# Patient Record
Sex: Female | Born: 1990 | Race: White | Hispanic: No | Smoking: Former smoker
Health system: Southern US, Community
[De-identification: ages and names within clinical notes are randomized; demographics above are authoritative.]

## PROBLEM LIST (undated history)

## (undated) ENCOUNTER — Inpatient Hospital Stay (HOSPITAL_COMMUNITY): Payer: Self-pay

## (undated) DIAGNOSIS — C439 Malignant melanoma of skin, unspecified: Secondary | ICD-10-CM

## (undated) DIAGNOSIS — T7840XA Allergy, unspecified, initial encounter: Secondary | ICD-10-CM

## (undated) DIAGNOSIS — M419 Scoliosis, unspecified: Secondary | ICD-10-CM

## (undated) HISTORY — DX: Allergy, unspecified, initial encounter: T78.40XA

## (undated) HISTORY — DX: Scoliosis, unspecified: M41.9

## (undated) HISTORY — PX: TONSILLECTOMY: SUR1361

## (undated) HISTORY — DX: Malignant melanoma of skin, unspecified: C43.9

---
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
## 2011-03-15 ENCOUNTER — Encounter: Payer: Self-pay | Admitting: Family Medicine

## 2011-04-12 ENCOUNTER — Emergency Department (HOSPITAL_COMMUNITY)
Admission: EM | Admit: 2011-04-12 | Discharge: 2011-04-12 | Disposition: A | Discharge status: Home / Self Care | Payer: Self-pay | Attending: Emergency Medicine | Admitting: Emergency Medicine

## 2011-04-12 ENCOUNTER — Emergency Department (HOSPITAL_COMMUNITY): Payer: Self-pay

## 2011-04-12 DIAGNOSIS — F3289 Other specified depressive episodes: Secondary | ICD-10-CM | POA: Insufficient documentation

## 2011-04-12 DIAGNOSIS — M79609 Pain in unspecified limb: Secondary | ICD-10-CM | POA: Insufficient documentation

## 2011-04-12 DIAGNOSIS — M545 Low back pain, unspecified: Secondary | ICD-10-CM | POA: Insufficient documentation

## 2011-04-12 DIAGNOSIS — F329 Major depressive disorder, single episode, unspecified: Secondary | ICD-10-CM | POA: Insufficient documentation

## 2011-04-12 DIAGNOSIS — H5710 Ocular pain, unspecified eye: Secondary | ICD-10-CM | POA: Insufficient documentation

## 2011-04-12 DIAGNOSIS — R110 Nausea: Secondary | ICD-10-CM | POA: Insufficient documentation

## 2011-04-12 DIAGNOSIS — F411 Generalized anxiety disorder: Secondary | ICD-10-CM | POA: Insufficient documentation

## 2011-04-12 DIAGNOSIS — H538 Other visual disturbances: Secondary | ICD-10-CM | POA: Insufficient documentation

## 2011-04-12 DIAGNOSIS — R51 Headache: Secondary | ICD-10-CM | POA: Insufficient documentation

## 2011-04-12 DIAGNOSIS — IMO0001 Reserved for inherently not codable concepts without codable children: Secondary | ICD-10-CM | POA: Insufficient documentation

## 2011-04-12 DIAGNOSIS — M546 Pain in thoracic spine: Secondary | ICD-10-CM | POA: Insufficient documentation

## 2011-04-12 LAB — URINE MICROSCOPIC-ADD ON

## 2011-04-12 LAB — URINALYSIS, ROUTINE W REFLEX MICROSCOPIC
Bilirubin Urine: NEGATIVE
Glucose, UA: NEGATIVE mg/dL
Ketones, ur: 15 mg/dL — AB
Protein, ur: NEGATIVE mg/dL
pH: 5.5 (ref 5.0–8.0)

## 2011-05-05 ENCOUNTER — Inpatient Hospital Stay (INDEPENDENT_AMBULATORY_CARE_PROVIDER_SITE_OTHER)
Admission: RE | Admit: 2011-05-05 | Discharge: 2011-05-05 | Disposition: A | Discharge status: Home / Self Care | Payer: Medicaid Other | Source: Ambulatory Visit | Attending: Emergency Medicine | Admitting: Emergency Medicine

## 2011-05-05 DIAGNOSIS — L989 Disorder of the skin and subcutaneous tissue, unspecified: Secondary | ICD-10-CM

## 2011-05-05 DIAGNOSIS — R599 Enlarged lymph nodes, unspecified: Secondary | ICD-10-CM

## 2011-11-15 ENCOUNTER — Inpatient Hospital Stay (HOSPITAL_COMMUNITY)
Admission: AD | Admit: 2011-11-15 | Discharge: 2011-11-15 | Disposition: A | Discharge status: Home / Self Care | Payer: Medicaid Other | Source: Ambulatory Visit | Attending: Obstetrics & Gynecology | Admitting: Obstetrics & Gynecology

## 2011-11-15 ENCOUNTER — Encounter (HOSPITAL_COMMUNITY): Payer: Self-pay

## 2011-11-15 ENCOUNTER — Inpatient Hospital Stay (HOSPITAL_COMMUNITY): Payer: Medicaid Other

## 2011-11-15 DIAGNOSIS — R109 Unspecified abdominal pain: Secondary | ICD-10-CM | POA: Insufficient documentation

## 2011-11-15 DIAGNOSIS — O09899 Supervision of other high risk pregnancies, unspecified trimester: Secondary | ICD-10-CM

## 2011-11-15 DIAGNOSIS — O99891 Other specified diseases and conditions complicating pregnancy: Secondary | ICD-10-CM | POA: Insufficient documentation

## 2011-11-15 DIAGNOSIS — O26899 Other specified pregnancy related conditions, unspecified trimester: Secondary | ICD-10-CM

## 2011-11-15 LAB — CBC
HCT: 34.3 % — ABNORMAL LOW (ref 36.0–46.0)
MCH: 30.5 pg (ref 26.0–34.0)
MCHC: 34.1 g/dL (ref 30.0–36.0)
MCV: 89.3 fL (ref 78.0–100.0)
Platelets: 275 10*3/uL (ref 150–400)
RDW: 13.3 % (ref 11.5–15.5)
WBC: 9.6 10*3/uL (ref 4.0–10.5)

## 2011-11-15 LAB — URINALYSIS, ROUTINE W REFLEX MICROSCOPIC
Bilirubin Urine: NEGATIVE
Glucose, UA: NEGATIVE mg/dL
Hgb urine dipstick: NEGATIVE
Ketones, ur: NEGATIVE mg/dL
Protein, ur: NEGATIVE mg/dL
Urobilinogen, UA: 0.2 mg/dL (ref 0.0–1.0)

## 2011-11-15 LAB — WET PREP, GENITAL
Clue Cells Wet Prep HPF POC: NONE SEEN
Trich, Wet Prep: NONE SEEN
Yeast Wet Prep HPF POC: NONE SEEN

## 2011-11-15 NOTE — MAU Provider Note (Signed)
Agree with note. See my note about plan of care.

## 2011-11-15 NOTE — MAU Note (Signed)
Patient states she had her first baby at 24 weeks at [facility] . Has been getting prenatal care in [place] but has been about one month she has been seen. Plans to stay in [facility] for delivery. Was told in [place] she need 17 P injections and a cerclage due to her first delivery. Has had no care in [place]. Has been having pain on the right side for about one month with a moderate green discharge. Had intercourse last night.

## 2011-11-15 NOTE — Discharge Instructions (Signed)
Abdominal Pain During Pregnancy °Belly (abdominal) pain is common during pregnancy. Most of the time, it is not a serious problem. Other times, it can be a sign that something is wrong with the pregnancy. Always tell your doctor if you have belly pain. °HOME CARE °For mild pain: °· Do not have sex (intercourse) or put anything in your vagina until you feel better.  °· Rest until your pain stops. If your pain lasts longer than 1 hour, call your doctor.  °· Drink clear fluids if you feel sick to your stomach (nauseous).  °· Do not eat solid food until you feel better.  °· Only take medicine as told by your doctor.  °· Keep all doctor visits as told.  °GET HELP RIGHT AWAY IF:  °· You are bleeding, leaking fluid, or pieces of tissue come out of your vagina.  °· You have more pain or cramping.  °· You keep throwing up (vomiting).  °· You have pain when you pee (urinate) or have blood in your pee.  °· You have a fever.  °· You do not feel your baby moving as much.  °· You feel very weak or feel like passing out.  °· You have trouble breathing, with or without belly pain.  °· You have a very bad headache and belly pain.  °· You have fluid leaking from your vagina and belly pain.  °· You keep having watery poop (diarrhea).  °· Your belly pain does not go away after resting, or the pain gets worse.  °MAKE SURE YOU:  °· Understand these instructions.  °· Will watch your condition.  °· Will get help right away if you are not doing well or get worse.  °Document Released: 07/18/2009 Document Revised: 07/19/2011 Document Reviewed: 02/23/2011 °[employer]® Patient Information ©2012 [employer]. °

## 2011-11-15 NOTE — Plan of Care (Signed)
I reviewed the patient's history and today's findings. She plans to start care with [facility], needs here records from [place] and Medicaid transferred. Recommend she start care ASAP so that 17-P can be started and decision can be made about the advisability of cerclage. We will request her prenatal records be sent to the MAU.  ARNOLD,JAMES 3:45 PM

## 2011-11-15 NOTE — MAU Provider Note (Signed)
History     CSN: [id number]  Arrival date & time 11/15/11  1151   None     Chief Complaint  Patient presents with  . Abdominal Pain    HPI Brenda Joyce is a 21 y.o. female @ [redacted]w[redacted]d gestation who presents to MAU for lower abdominal cramping and vaginal discharge that is green. The symptoms started about a month ago. Started prenatal care in [place] and had an ultrasound at [redacted] weeks gestation. Patient is concerned because she is having the same symptoms as she did when she delivered a preterm baby at [redacted] weeks gestation. (Baby did live).  Patient told incompetent cervix. The history was provided by the patient.  Past Medical History  Diagnosis Date  . Asthma   . Allergy   . Scoliosis   . Melanoma     right wrist    Past Surgical History  Procedure Date  . Tonsillectomy     Family History  Problem Relation Age of Onset  . Anesthesia problems Neg Hx     History  Substance Use Topics  . Smoking status: Never Smoker   . Smokeless tobacco: Not on file  . Alcohol Use: No    OB History    Grav Para Term Preterm Abortions TAB SAB Ect Mult Living   2 1 0 1 0 0 0 0 0 1       Review of Systems  Constitutional: Negative for fever, chills, diaphoresis and fatigue.  HENT: Negative for ear pain, congestion, sore throat, facial swelling, neck pain, neck stiffness, dental problem and sinus pressure.   Eyes: Negative for photophobia, pain and discharge.  Respiratory: Negative for cough, chest tightness and wheezing.   Gastrointestinal: Negative for nausea, vomiting, abdominal pain, diarrhea, constipation and abdominal distention.  Genitourinary: Positive for vaginal discharge. Negative for dysuria, frequency, flank pain, vaginal bleeding and difficulty urinating.  Musculoskeletal: Negative for myalgias, back pain and gait problem.  Skin: Negative for color change and rash.  Neurological: Negative for dizziness, speech difficulty, weakness, light-headedness, numbness and headaches.    Psychiatric/Behavioral: Negative for confusion and agitation.    Allergies  Review of patient's allergies indicates no known allergies.  Home Medications  No current outpatient prescriptions on file.  BP 142/98  Pulse 120  Temp(Src) 98.1 F (36.7 C) (Oral)  Resp 16  Ht 5\' 3"  (1.6 m)  Wt 118 lb 9.6 oz (53.797 kg)  BMI 21.01 kg/m2  SpO2 100%  LMP 08/06/2011  Physical Exam  Nursing note and vitals reviewed. Constitutional: She is oriented to person, place, and time. She appears well-developed and well-nourished. No distress.  HENT:  Head: Normocephalic.  Eyes: EOM are normal.  Neck: Neck supple.  Cardiovascular: Normal rate.   Pulmonary/Chest: Effort normal.  Abdominal: Soft. There is no tenderness.  Genitourinary:       External genitalia without lesions, mucous discharge vaginal vault. Cervix, external os fingertip, internal os closed. Uterus 14 to 16 week size.   Musculoskeletal: Normal range of motion.  Neurological: She is alert and oriented to person, place, and time. No cranial nerve deficit.  Skin: Skin is warm and dry.  Psychiatric: She has a normal mood and affect. Her behavior is normal. Judgment and thought content normal.   . Results for orders placed during the hospital encounter of 11/15/11 (from the past 24 hour(s))  URINALYSIS, ROUTINE W REFLEX MICROSCOPIC     Status: Abnormal   Collection Time   11/15/11 12:25 PM      Component  Value Range   Color, Urine YELLOW  YELLOW    APPearance HAZY (*) CLEAR    Specific Gravity, Urine 1.025  1.005 - 1.030    pH 6.0  5.0 - 8.0    Glucose, UA NEGATIVE  NEGATIVE (mg/dL)   Hgb urine dipstick NEGATIVE  NEGATIVE    Bilirubin Urine NEGATIVE  NEGATIVE    Ketones, ur NEGATIVE  NEGATIVE (mg/dL)   Protein, ur NEGATIVE  NEGATIVE (mg/dL)   Urobilinogen, UA 0.2  0.0 - 1.0 (mg/dL)   Nitrite NEGATIVE  NEGATIVE    Leukocytes, UA NEGATIVE  NEGATIVE   CBC     Status: Abnormal   Collection Time   11/15/11  1:05 PM       Component Value Range   WBC 9.6  4.0 - 10.5 (K/uL)   RBC 3.84 (*) 3.87 - 5.11 (MIL/uL)   Hemoglobin 11.7 (*) 12.0 - 15.0 (g/dL)   HCT 40.9 (*) 81.1 - 46.0 (%)   MCV 89.3  78.0 - 100.0 (fL)   MCH 30.5  26.0 - 34.0 (pg)   MCHC 34.1  30.0 - 36.0 (g/dL)   RDW 91.4  78.2 - 95.6 (%)   Platelets 275  150 - 400 (K/uL)  ABO/RH     Status: Normal (Preliminary result)   Collection Time   11/15/11  1:05 PM      Component Value Range   ABO/RH(D) A POS     Ultrasound shows a 15 week IUP with cervical length 3.1 cm.  Patient had prenatal care with [facility] with last pregnancy and plan care there again but they will not schedule an appointment until she has her records.   Assessment: Abdominal pain in pregnancy  Plan:   Start prenatal care ED Course: Consult with Dr. Debroah Loop. Will come to MAU to discuss plan of care with patient.  Procedures  Will have patient sign release of information so she can take prenatal records from [place] to Dr. Ebony Hail office. Dr. Debroah Loop discussed with the patient in detail follow up and prenatal care. ( See Dr. Olivia Mackie note)   MDM

## 2011-11-16 LAB — GC/CHLAMYDIA PROBE AMP, GENITAL: Chlamydia, DNA Probe: NEGATIVE

## 2011-11-23 ENCOUNTER — Inpatient Hospital Stay (HOSPITAL_COMMUNITY)
Admission: AD | Admit: 2011-11-23 | Discharge: 2011-11-23 | Disposition: A | Discharge status: Home / Self Care | Payer: Medicaid Other | Source: Ambulatory Visit | Attending: Obstetrics & Gynecology | Admitting: Obstetrics & Gynecology

## 2011-11-23 ENCOUNTER — Encounter (HOSPITAL_COMMUNITY): Payer: Self-pay | Admitting: *Deleted

## 2011-11-23 DIAGNOSIS — N949 Unspecified condition associated with female genital organs and menstrual cycle: Secondary | ICD-10-CM

## 2011-11-23 DIAGNOSIS — O99891 Other specified diseases and conditions complicating pregnancy: Secondary | ICD-10-CM | POA: Insufficient documentation

## 2011-11-23 DIAGNOSIS — R109 Unspecified abdominal pain: Secondary | ICD-10-CM | POA: Insufficient documentation

## 2011-11-23 DIAGNOSIS — O09219 Supervision of pregnancy with history of pre-term labor, unspecified trimester: Secondary | ICD-10-CM

## 2011-11-23 LAB — URINALYSIS, ROUTINE W REFLEX MICROSCOPIC
Bilirubin Urine: NEGATIVE
Leukocytes, UA: NEGATIVE
Nitrite: NEGATIVE
Specific Gravity, Urine: 1.025 (ref 1.005–1.030)
Urobilinogen, UA: 0.2 mg/dL (ref 0.0–1.0)
pH: 6.5 (ref 5.0–8.0)

## 2011-11-23 NOTE — MAU Provider Note (Signed)
History     CSN: [id number]  Arrival date and time: 11/23/11 1510   First Provider Initiated Contact with Patient 11/23/11 1608      Chief Complaint  Patient presents with  . Abdominal Cramping   HPI 21 y.o. G2P0101 at [redacted]w[redacted]d with low abd pain, constant pain that is crampy in nature in bilateral lower quadrants, worse with walking or sudden movements (like driving over bumps in her car). No bleeding or discharge. H/O preterm delivery at 24 weeks. Was seen in MAU a few days ago for the same symptoms, states her pain is worsening now. She has been planning on prenatal care with [facility], she stated at her last MAU visit that she could not start her care there until she got her prior records from [place], however today she states she can't schedule an appointment there until her Medicaid, which she just applied for, is approved. At last visit, wet prep, cultures and UA were negative, normal u/s with cervical length of 3.1 cm.    Past Medical History  Diagnosis Date  . Asthma   . Allergy   . Scoliosis   . Melanoma     right wrist    Past Surgical History  Procedure Date  . Tonsillectomy   . Tonsilectomy, adenoidectomy, bilateral myringotomy and tubes 2006    Family History  Problem Relation Age of Onset  . Anesthesia problems Neg Hx   . Cancer Mother   . Hypertension Mother   . Mental illness Father   . Cancer Sister     History  Substance Use Topics  . Smoking status: Never Smoker   . Smokeless tobacco: Never Used  . Alcohol Use: No    Allergies: No Known Allergies  No prescriptions prior to admission    Review of Systems  Constitutional: Negative.   Respiratory: Negative.   Cardiovascular: Negative.   Gastrointestinal: Positive for abdominal pain. Negative for nausea, vomiting, diarrhea and constipation.  Genitourinary: Negative for dysuria, urgency, frequency, hematuria and flank pain.       Negative for vaginal bleeding  Musculoskeletal: Negative.    Neurological: Negative.   Psychiatric/Behavioral: Negative.    Physical Exam   Blood pressure 107/64, pulse 93, temperature 98.9 F (37.2 C), temperature source Oral, resp. rate 16, last menstrual period 08/06/2011, SpO2 100.00%, unknown if currently breastfeeding.  Physical Exam  Nursing note and vitals reviewed. Constitutional: She is oriented to person, place, and time. She appears well-developed and well-nourished. No distress.  Cardiovascular: Normal rate.   Respiratory: Effort normal.  GI: Soft. She exhibits no distension and no mass. There is tenderness (bilateral lower quadrants). There is no rebound and no guarding.  Genitourinary:       SVE: closed/thick/high  Musculoskeletal: Normal range of motion.  Neurological: She is alert and oriented to person, place, and time.  Skin: Skin is warm and dry.  Psychiatric: She has a normal mood and affect.    MAU Course  Procedures Results for orders placed during the hospital encounter of 11/23/11 (from the past 24 hour(s))  URINALYSIS, ROUTINE W REFLEX MICROSCOPIC     Status: Abnormal   Collection Time   11/23/11  3:20 PM      Component Value Range   Color, Urine YELLOW  YELLOW    APPearance HAZY (*) CLEAR    Specific Gravity, Urine 1.025  1.005 - 1.030    pH 6.5  5.0 - 8.0    Glucose, UA NEGATIVE  NEGATIVE (mg/dL)   Hgb  urine dipstick NEGATIVE  NEGATIVE    Bilirubin Urine NEGATIVE  NEGATIVE    Ketones, ur NEGATIVE  NEGATIVE (mg/dL)   Protein, ur NEGATIVE  NEGATIVE (mg/dL)   Urobilinogen, UA 0.2  0.0 - 1.0 (mg/dL)   Nitrite NEGATIVE  NEGATIVE    Leukocytes, UA NEGATIVE  NEGATIVE      Assessment and Plan  21 y.o. G2P0101 at [redacted]w[redacted]d Low abd pain - c/w round ligament pain History of 24 week delivery Request sent to clinic for follow up in 1-2 weeks - pt will not have medicaid approved for at least a month, needs closer follow up d/t history Precautions rev'd  Brenda Joyce 11/23/2011, 4:17 PM

## 2011-11-23 NOTE — MAU Note (Signed)
Patient having lower abdominal cramping and upper abdominal pain, especially when walking, no vaginal discharge, no vaginal bleeding, regular bowel movements, felt dizzy before and almost passed out, patient is [redacted] week gestation.

## 2011-11-26 ENCOUNTER — Encounter: Payer: Self-pay | Admitting: Family

## 2011-11-27 NOTE — MAU Provider Note (Signed)
Patient needs cervical cerclage placement in next 1-2 weeks.  Please have her seen in HR clinic.  Attestation of Attending Supervision of Advanced Practitioner: Evaluation and management procedures were performed by the [facility] Fellow/PA/CNM/NP under my supervision and collaboration. Chart reviewed, and agree with management and plan.  Jaynie Collins, M.D. 11/27/2011 11:28 AM

## 2011-12-03 ENCOUNTER — Ambulatory Visit (INDEPENDENT_AMBULATORY_CARE_PROVIDER_SITE_OTHER): Payer: BC Managed Care – PPO | Admitting: Family Medicine

## 2011-12-03 ENCOUNTER — Encounter: Payer: Self-pay | Admitting: Family

## 2011-12-03 ENCOUNTER — Encounter: Payer: Self-pay | Admitting: Family Medicine

## 2011-12-03 VITALS — BP 107/67 | HR 94 | Temp 97.9°F | Wt 120.7 lb

## 2011-12-03 DIAGNOSIS — J309 Allergic rhinitis, unspecified: Secondary | ICD-10-CM

## 2011-12-03 DIAGNOSIS — J3089 Other allergic rhinitis: Secondary | ICD-10-CM | POA: Insufficient documentation

## 2011-12-03 DIAGNOSIS — O09219 Supervision of pregnancy with history of pre-term labor, unspecified trimester: Secondary | ICD-10-CM

## 2011-12-03 DIAGNOSIS — J4530 Mild persistent asthma, uncomplicated: Secondary | ICD-10-CM | POA: Insufficient documentation

## 2011-12-03 DIAGNOSIS — J45909 Unspecified asthma, uncomplicated: Secondary | ICD-10-CM

## 2011-12-03 DIAGNOSIS — O0990 Supervision of high risk pregnancy, unspecified, unspecified trimester: Secondary | ICD-10-CM | POA: Insufficient documentation

## 2011-12-03 DIAGNOSIS — M4120 Other idiopathic scoliosis, site unspecified: Secondary | ICD-10-CM

## 2011-12-03 DIAGNOSIS — C4360 Malignant melanoma of unspecified upper limb, including shoulder: Secondary | ICD-10-CM

## 2011-12-03 DIAGNOSIS — M419 Scoliosis, unspecified: Secondary | ICD-10-CM

## 2011-12-03 LAB — POCT URINALYSIS DIP (DEVICE)
Bilirubin Urine: NEGATIVE
Leukocytes, UA: NEGATIVE
Nitrite: NEGATIVE
Protein, ur: NEGATIVE mg/dL
pH: 5 (ref 5.0–8.0)

## 2011-12-03 NOTE — Progress Notes (Signed)
Discussed cerclage--will check serial cervical lengths, start 17 P---cervix is long/closed and posterior--will follow with serial cervical lengths. Declines genetics Schedule anatomy Get records from [place], had new OB labs and pap there.

## 2011-12-03 NOTE — Progress Notes (Signed)
U/S scheduled 12/05/11 at 915 am and on 12/17/11 at 1030 am. ROI signed for [facility] in [place].

## 2011-12-03 NOTE — Progress Notes (Signed)
Pulse- 94 

## 2011-12-03 NOTE — Patient Instructions (Signed)
Pregnancy - Second Trimester The second trimester of pregnancy (3 to 6 months) is a period of rapid growth for you and your baby. At the end of the sixth month, your baby is about 9 inches long and weighs 1 1/2 pounds. You will begin to feel the baby move between 18 and 20 weeks of the pregnancy. This is called quickening. Weight gain is faster. A clear fluid (colostrum) may leak out of your breasts. You may feel small contractions of the womb (uterus). This is known as false labor or Braxton-Hicks contractions. This is like a practice for labor when the baby is ready to be born. Usually, the problems with morning sickness have usually passed by the end of your first trimester. Some women develop small dark blotches (called cholasma, mask of pregnancy) on their face that usually goes away after the baby is born. Exposure to the sun makes the blotches worse. Acne may also develop in some pregnant women and pregnant women who have acne, may find that it goes away. PRENATAL EXAMS  Blood work may continue to be done during prenatal exams. These tests are done to check on your health and the probable health of your baby. Blood work is used to follow your blood levels (hemoglobin). Anemia (low hemoglobin) is common during pregnancy. Iron and vitamins are given to help prevent this. You will also be checked for diabetes between 24 and 28 weeks of the pregnancy. Some of the previous blood tests may be repeated.   The size of the uterus is measured during each visit. This is to make sure that the baby is continuing to grow properly according to the dates of the pregnancy.   Your blood pressure is checked every prenatal visit. This is to make sure you are not getting toxemia.   Your urine is checked to make sure you do not have an infection, diabetes or protein in the urine.   Your weight is checked often to make sure gains are happening at the suggested rate. This is to ensure that both you and your baby are  growing normally.   Sometimes, an ultrasound is performed to confirm the proper growth and development of the baby. This is a test which bounces harmless sound waves off the baby so your caregiver can more accurately determine due dates.  Sometimes, a specialized test is done on the amniotic fluid surrounding the baby. This test is called an amniocentesis. The amniotic fluid is obtained by sticking a needle into the belly (abdomen). This is done to check the chromosomes in instances where there is a concern about possible genetic problems with the baby. It is also sometimes done near the end of pregnancy if an early delivery is required. In this case, it is done to help make sure the baby's lungs are mature enough for the baby to live outside of the womb. CHANGES OCCURING IN THE SECOND TRIMESTER OF PREGNANCY Your body goes through many changes during pregnancy. They vary from person to person. Talk to your caregiver about changes you notice that you are concerned about.  During the second trimester, you will likely have an increase in your appetite. It is normal to have cravings for certain foods. This varies from person to person and pregnancy to pregnancy.   Your lower abdomen will begin to bulge.   You may have to urinate more often because the uterus and baby are pressing on your bladder. It is also common to get more bladder infections during pregnancy (  pain with urination). You can help this by drinking lots of fluids and emptying your bladder before and after intercourse.   You may begin to get stretch marks on your hips, abdomen, and breasts. These are normal changes in the body during pregnancy. There are no exercises or medications to take that prevent this change.   You may begin to develop swollen and bulging veins (varicose veins) in your legs. Wearing support hose, elevating your feet for 15 minutes, 3 to 4 times a day and limiting salt in your diet helps lessen the problem.    Heartburn may develop as the uterus grows and pushes up against the stomach. Antacids recommended by your caregiver helps with this problem. Also, eating smaller meals 4 to 5 times a day helps.   Constipation can be treated with a stool softener or adding bulk to your diet. Drinking lots of fluids, vegetables, fruits, and whole grains are helpful.   Exercising is also helpful. If you have been very active up until your pregnancy, most of these activities can be continued during your pregnancy. If you have been less active, it is helpful to start an exercise program such as walking.   Hemorrhoids (varicose veins in the rectum) may develop at the end of the second trimester. Warm sitz baths and hemorrhoid cream recommended by your caregiver helps hemorrhoid problems.   Backaches may develop during this time of your pregnancy. Avoid heavy lifting, wear low heal shoes and practice good posture to help with backache problems.   Some pregnant women develop tingling and numbness of their hand and fingers because of swelling and tightening of ligaments in the wrist (carpel tunnel syndrome). This goes away after the baby is born.   As your breasts enlarge, you may have to get a bigger bra. Get a comfortable, cotton, support bra. Do not get a nursing bra until the last month of the pregnancy if you will be nursing the baby.   You may get a dark line from your belly button to the pubic area called the linea nigra.   You may develop rosy cheeks because of increase blood flow to the face.   You may develop spider looking lines of the face, neck, arms and chest. These go away after the baby is born.  HOME CARE INSTRUCTIONS   It is extremely important to avoid all smoking, herbs, alcohol, and unprescribed drugs during your pregnancy. These chemicals affect the formation and growth of the baby. Avoid these chemicals throughout the pregnancy to ensure the delivery of a healthy infant.   Most of your home  care instructions are the same as suggested for the first trimester of your pregnancy. Keep your caregiver's appointments. Follow your caregiver's instructions regarding medication use, exercise and diet.   During pregnancy, you are providing food for you and your baby. Continue to eat regular, well-balanced meals. Choose foods such as meat, fish, milk and other low fat dairy products, vegetables, fruits, and whole-grain breads and cereals. Your caregiver will tell you of the ideal weight gain.   A physical sexual relationship may be continued up until near the end of pregnancy if there are no other problems. Problems could include early (premature) leaking of amniotic fluid from the membranes, vaginal bleeding, abdominal pain, or other medical or pregnancy problems.   Exercise regularly if there are no restrictions. Check with your caregiver if you are unsure of the safety of some of your exercises. The greatest weight gain will occur in the   last 2 trimesters of pregnancy. Exercise will help you:   Control your weight.   Get you in shape for labor and delivery.   Lose weight after you have the baby.   Wear a good support or jogging bra for breast tenderness during pregnancy. This may help if worn during sleep. Pads or tissues may be used in the bra if you are leaking colostrum.   Do not use hot tubs, steam rooms or saunas throughout the pregnancy.   Wear your seat belt at all times when driving. This protects you and your baby if you are in an accident.   Avoid raw meat, uncooked cheese, cat litter boxes and soil used by cats. These carry germs that can cause birth defects in the baby.   The second trimester is also a good time to visit your dentist for your dental health if this has not been done yet. Getting your teeth cleaned is OK. Use a soft toothbrush. Brush gently during pregnancy.   It is easier to loose urine during pregnancy. Tightening up and strengthening the pelvic muscles will  help with this problem. Practice stopping your urination while you are going to the bathroom. These are the same muscles you need to strengthen. It is also the muscles you would use as if you were trying to stop from passing gas. You can practice tightening these muscles up 10 times a set and repeating this about 3 times per day. Once you know what muscles to tighten up, do not perform these exercises during urination. It is more likely to contribute to an infection by backing up the urine.   Ask for help if you have financial, counseling or nutritional needs during pregnancy. Your caregiver will be able to offer counseling for these needs as well as refer you for other special needs.   Your skin may become oily. If so, wash your face with mild soap, use non-greasy moisturizer and oil or cream based makeup.  MEDICATIONS AND DRUG USE IN PREGNANCY  Take prenatal vitamins as directed. The vitamin should contain 1 milligram of folic acid. Keep all vitamins out of reach of children. Only a couple vitamins or tablets containing iron may be fatal to a baby or young child when ingested.   Avoid use of all medications, including herbs, over-the-counter medications, not prescribed or suggested by your caregiver. Only take over-the-counter or prescription medicines for pain, discomfort, or fever as directed by your caregiver. Do not use aspirin.   Let your caregiver also know about herbs you may be using.   Alcohol is related to a number of birth defects. This includes fetal alcohol syndrome. All alcohol, in any form, should be avoided completely. Smoking will cause low birth rate and premature babies.   Street or illegal drugs are very harmful to the baby. They are absolutely forbidden. A baby born to an addicted mother will be addicted at birth. The baby will go through the same withdrawal an adult does.  SEEK MEDICAL CARE IF:  You have any concerns or worries during your pregnancy. It is better to call with  your questions if you feel they cannot wait, rather than worry about them. SEEK IMMEDIATE MEDICAL CARE IF:   An unexplained oral temperature above 102 F (38.9 C) develops, or as your caregiver suggests.   You have leaking of fluid from the vagina (birth canal). If leaking membranes are suspected, take your temperature and tell your caregiver of this when you call.   There   is vaginal spotting, bleeding, or passing clots. Tell your caregiver of the amount and how many pads are used. Light spotting in pregnancy is common, especially following intercourse.   You develop a bad smelling vaginal discharge with a change in the color from clear to white.   You continue to feel sick to your stomach (nauseated) and have no relief from remedies suggested. You vomit blood or coffee ground-like materials.   You lose more than 2 pounds of weight or gain more than 2 pounds of weight over 1 week, or as suggested by your caregiver.   You notice swelling of your face, hands, feet, or legs.   You get exposed to German measles and have never had them.   You are exposed to fifth disease or chickenpox.   You develop belly (abdominal) pain. Round ligament discomfort is a common non-cancerous (benign) cause of abdominal pain in pregnancy. Your caregiver still must evaluate you.   You develop a bad headache that does not go away.   You develop fever, diarrhea, pain with urination, or shortness of breath.   You develop visual problems, blurry, or double vision.   You fall or are in a car accident or any kind of trauma.   There is mental or physical violence at home.  Document Released: 07/24/2001 Document Revised: 07/19/2011 Document Reviewed: 01/26/2009 [employer] Patient Information 2012 [employer]. Breastfeeding BENEFITS OF BREASTFEEDING For the baby  The first milk (colostrum) helps the baby's digestive system function better.   There are antibodies from the mother in the milk that help the  baby fight off infections.   The baby has a lower incidence of asthma, allergies, and SIDS (sudden infant death syndrome).   The nutrients in breast milk are better than formulas for the baby and helps the baby's brain grow better.   Babies who breastfeed have less gas, colic, and constipation.  For the mother  Breastfeeding helps develop a very special bond between mother and baby.   It is more convenient, always available at the correct temperature and cheaper than formula feeding.   It burns calories in the mother and helps with losing weight that was gained during pregnancy.   It makes the uterus contract back down to normal size faster and slows bleeding following delivery.   Breastfeeding mothers have a lower risk of developing breast cancer.  NURSE FREQUENTLY  A healthy, full-term baby may breastfeed as often as every hour or space his or her feedings to every 3 hours.   How often to nurse will vary from baby to baby. Watch your baby for signs of hunger, not the clock.   Nurse as often as the baby requests, or when you feel the need to reduce the fullness of your breasts.   Awaken the baby if it has been 3 to 4 hours since the last feeding.   Frequent feeding will help the mother make more milk and will prevent problems like sore nipples and engorgement of the breasts.  BABY'S POSITION AT THE BREAST  Whether lying down or sitting, be sure that the baby's tummy is facing your tummy.   Support the breast with 4 fingers underneath the breast and the thumb above. Make sure your fingers are well away from the nipple and baby's mouth.   Stroke the baby's lips and cheek closest to the breast gently with your finger or nipple.   When the baby's mouth is open wide enough, place all of your nipple and as much   of the dark area around the nipple as possible into your baby's mouth.   Pull the baby in close so the tip of the nose and the baby's cheeks touch the breast during the  feeding.  FEEDINGS  The length of each feeding varies from baby to baby and from feeding to feeding.   The baby must suck about 2 to 3 minutes for your milk to get to him or her. This is called a "let down." For this reason, allow the baby to feed on each breast as long as he or she wants. Your baby will end the feeding when he or she has received the right balance of nutrients.   To break the suction, put your finger into the corner of the baby's mouth and slide it between his or her gums before removing your breast from his or her mouth. This will help prevent sore nipples.  REDUCING BREAST ENGORGEMENT  In the first week after your baby is born, you may experience signs of breast engorgement. When breasts are engorged, they feel heavy, warm, full, and may be tender to the touch. You can reduce engorgement if you:   Nurse frequently, every 2 to 3 hours. Mothers who breastfeed early and often have fewer problems with engorgement.   Place light ice packs on your breasts between feedings. This reduces swelling. Wrap the ice packs in a lightweight towel to protect your skin.   Apply moist hot packs to your breast for 5 to 10 minutes before each feeding. This increases circulation and helps the milk flow.   Gently massage your breast before and during the feeding.   Make sure that the baby empties at least one breast at every feeding before switching sides.   Use a breast pump to empty the breasts if your baby is sleepy or not nursing well. You may also want to pump if you are returning to work or or you feel you are getting engorged.   Avoid bottle feeds, pacifiers or supplemental feedings of water or juice in place of breastfeeding.   Be sure the baby is latched on and positioned properly while breastfeeding.   Prevent fatigue, stress, and anemia.   Wear a supportive bra, avoiding underwire styles.   Eat a balanced diet with enough fluids.  If you follow these suggestions, your  engorgement should improve in 24 to 48 hours. If you are still experiencing difficulty, call your lactation consultant or caregiver. IS MY BABY GETTING ENOUGH MILK? Sometimes, mothers worry about whether their babies are getting enough milk. You can be assured that your baby is getting enough milk if:  The baby is actively sucking and you hear swallowing.   The baby nurses at least 8 to 12 times in a 24 hour time period. Nurse your baby until he or she unlatches or falls asleep at the first breast (at least 10 to 20 minutes), then offer the second side.   The baby is wetting 5 to 6 disposable diapers (6 to 8 cloth diapers) in a 24 hour period by 5 to 6 days of age.   The baby is having at least 2 to 3 stools every 24 hours for the first few months. Breast milk is all the food your baby needs. It is not necessary for your baby to have water or formula. In fact, to help your breasts make more milk, it is best not to give your baby supplemental feedings during the early weeks.   The stool should   be soft and yellow.   The baby should gain 4 to 7 ounces per week after he is 4 days old.  TAKE CARE OF YOURSELF Take care of your breasts by:  Bathing or showering daily.   Avoiding the use of soaps on your nipples.   Start feedings on your left breast at one feeding and on your right breast at the next feeding.   You will notice an increase in your milk supply 2 to 5 days after delivery. You may feel some discomfort from engorgement, which makes your breasts very firm and often tender. Engorgement "peaks" out within 24 to 48 hours. In the meantime, apply warm moist towels to your breasts for 5 to 10 minutes before feeding. Gentle massage and expression of some milk before feeding will soften your breasts, making it easier for your baby to latch on. Wear a well fitting nursing bra and air dry your nipples for 10 to 15 minutes after each feeding.   Only use cotton bra pads.   Only use pure lanolin on  your nipples after nursing. You do not need to wash it off before nursing.  Take care of yourself by:   Eating well-balanced meals and nutritious snacks.   Drinking milk, fruit juice, and water to satisfy your thirst (about 8 glasses a day).   Getting plenty of rest.   Increasing calcium in your diet (1200 mg a day).   Avoiding foods that you notice affect the baby in a bad way.  SEEK MEDICAL CARE IF:   You have any questions or difficulty with breastfeeding.   You need help.   You have a hard, red, sore area on your breast, accompanied by a fever of 100.5 F (38.1 C) or more.   Your baby is too sleepy to eat well or is having trouble sleeping.   Your baby is wetting less than 6 diapers per day, by 5 days of age.   Your baby's skin or white part of his or her eyes is more yellow than it was in the hospital.   You feel depressed.  Document Released: 07/30/2005 Document Revised: 07/19/2011 Document Reviewed: 03/14/2009 [employer] Patient Information 2012 [employer]. 

## 2011-12-05 ENCOUNTER — Ambulatory Visit (HOSPITAL_COMMUNITY)
Admission: RE | Admit: 2011-12-05 | Discharge: 2011-12-05 | Disposition: A | Discharge status: Home / Self Care | Payer: BC Managed Care – PPO | Source: Ambulatory Visit | Attending: Family Medicine | Admitting: Family Medicine

## 2011-12-05 DIAGNOSIS — Z8751 Personal history of pre-term labor: Secondary | ICD-10-CM | POA: Insufficient documentation

## 2011-12-05 DIAGNOSIS — Z3689 Encounter for other specified antenatal screening: Secondary | ICD-10-CM | POA: Insufficient documentation

## 2011-12-07 LAB — OB RESULTS CONSOLE HIV ANTIBODY (ROUTINE TESTING): HIV: NONREACTIVE

## 2011-12-07 LAB — OB RESULTS CONSOLE RUBELLA ANTIBODY, IGM: Rubella: IMMUNE

## 2011-12-07 LAB — OB RESULTS CONSOLE ABO/RH

## 2011-12-17 ENCOUNTER — Encounter: Payer: BC Managed Care – PPO | Admitting: Advanced Practice Midwife

## 2011-12-17 ENCOUNTER — Ambulatory Visit (HOSPITAL_COMMUNITY): Payer: Medicaid Other | Attending: Family Medicine

## 2011-12-25 ENCOUNTER — Inpatient Hospital Stay (HOSPITAL_COMMUNITY)
Admission: AD | Admit: 2011-12-25 | Discharge: 2011-12-25 | Disposition: A | Discharge status: Home / Self Care | Payer: BC Managed Care – PPO | Source: Ambulatory Visit | Attending: Obstetrics and Gynecology | Admitting: Obstetrics and Gynecology

## 2011-12-25 ENCOUNTER — Encounter (HOSPITAL_COMMUNITY): Payer: Self-pay | Admitting: *Deleted

## 2011-12-25 DIAGNOSIS — O99891 Other specified diseases and conditions complicating pregnancy: Secondary | ICD-10-CM | POA: Insufficient documentation

## 2011-12-25 DIAGNOSIS — Z711 Person with feared health complaint in whom no diagnosis is made: Secondary | ICD-10-CM

## 2011-12-25 DIAGNOSIS — Z331 Pregnant state, incidental: Secondary | ICD-10-CM

## 2011-12-25 LAB — URINALYSIS, ROUTINE W REFLEX MICROSCOPIC
Bilirubin Urine: NEGATIVE
Glucose, UA: NEGATIVE mg/dL
Ketones, ur: NEGATIVE mg/dL
Leukocytes, UA: NEGATIVE
Nitrite: NEGATIVE
Protein, ur: NEGATIVE mg/dL
Specific Gravity, Urine: 1.01 (ref 1.005–1.030)
Urobilinogen, UA: 0.2 mg/dL (ref 0.0–1.0)
pH: 7.5 (ref 5.0–8.0)

## 2011-12-25 LAB — OB RESULTS CONSOLE GBS: GBS: NEGATIVE

## 2011-12-25 NOTE — MAU Note (Signed)
Pt reports she was seen in her md's office today for a GBS swab because the doctor thinks that may have been what caused her PTL with a previous preg. Stats this pm she passed "a lot" of mucus and started having sharp lower abd pain.

## 2011-12-25 NOTE — MAU Provider Note (Signed)
Chief Complaint:  Loss of mucus plub   First Provider Initiated Contact with Patient 12/25/11 2206      Brenda Joyce is  21 y.o. G2P0101.  Patient's last menstrual period was 08/06/2011..  [redacted]w[redacted]d   She presents complaining of loss of mucus plug. Onset is described as sudden and has been present for1 hours. Prior to arrival in MAU. States large amount of mucus discharge after voiding. Denies abd pain, bleeding, LOF. Reports fetal movement. Reports visit in office today with Dr. Ambrose Mantle. States GBS collected in office. Denies cervical or speculum exam today during visit.  Obstetrical/Gynecological History: OB History    Grav Para Term Preterm Abortions TAB SAB Ect Mult Living   2 1 0 1 0 0 0 0 0 1       Past Medical History: Past Medical History  Diagnosis Date  . Asthma   . Allergy   . Scoliosis   . Melanoma     right wrist    Past Surgical History: Past Surgical History  Procedure Date  . Tonsillectomy   . Tonsillectomy and adenoidectomy 2006    Family History: Family History  Problem Relation Age of Onset  . Anesthesia problems Neg Hx   . Cancer Mother     stomach cancer  . Hypertension Mother   . Muscular dystrophy Mother   . Mental illness Father     bipolar, schizoprenia  . Cancer Sister     cervical  . Diabetes Maternal Grandmother   . Diabetes Maternal Grandfather   . Diabetes Paternal Grandmother   . Diabetes Paternal Grandfather     Social History: History  Substance Use Topics  . Smoking status: Passive Smoker    Types: Cigarettes  . Smokeless tobacco: Never Used  . Alcohol Use: No    Allergies: No Known Allergies  Prescriptions prior to admission  Medication Sig Dispense Refill  . albuterol (PROVENTIL HFA;VENTOLIN HFA) 108 (90 BASE) MCG/ACT inhaler Inhale 2 puffs into the lungs every 6 (six) hours as needed. Takes for shortness of breath      . beclomethasone (QVAR) 40 MCG/ACT inhaler Inhale 2 puffs into the lungs 2 (two) times daily.          . cetirizine (ZYRTEC) 10 MG tablet Take 10 mg by mouth daily.        . fluticasone (VERAMYST) 27.5 MCG/SPRAY nasal spray Place 2 sprays into the nose daily.        . Prenatal Vit-Fe Fumarate-FA (PRENATAL MULTIVITAMIN) TABS Take 1 tablet by mouth daily.        Review of Systems - Negative except what has been reviewed in HPI  Physical Exam   Blood pressure 137/93, pulse 100, temperature 98.4 F (36.9 C), temperature source Oral, resp. rate 18, height 5\' 3"  (1.6 m), weight 124 lb (56.246 kg), last menstrual period 08/06/2011, SpO2 100.00%.  General: General appearance - alert, well appearing, and in no distress and oriented to person, place, and time Mental status - alert, oriented to person, place, and time, normal mood, behavior, speech, dress, motor activity, and thought processes, affect appropriate to mood, comfortable appearing Eyes - left eye normal, right eye normal Abdomen - gravid, non tender Extremities - peripheral pulses normal, no pedal edema, no clubbing or cyanosis Focused Gynecological Exam: VULVA: normal appearing vulva with no masses, tenderness or lesions, VAGINA: normal appearing vagina with normal color and discharge, no lesions, CERVIX: normal appearing cervix without discharge or lesions, UTERUS: enlarged to 20 week's size, non tender  MD Consult: Discussed with Dr. Ambrose Mantle   Assessment: 1. Worried well   2. Pregnant state, incidental     Plan: Discharge home FU in office as scheduled  Brenda Joyce E. 12/25/2011,10:14 PM

## 2011-12-25 NOTE — Discharge Instructions (Signed)

## 2012-03-11 ENCOUNTER — Inpatient Hospital Stay (HOSPITAL_COMMUNITY)
Admission: AD | Admit: 2012-03-11 | Discharge: 2012-03-11 | Disposition: A | Discharge status: Home / Self Care | Payer: BC Managed Care – PPO | Source: Ambulatory Visit | Attending: Obstetrics and Gynecology | Admitting: Obstetrics and Gynecology

## 2012-03-11 ENCOUNTER — Encounter (HOSPITAL_COMMUNITY): Payer: Self-pay

## 2012-03-11 DIAGNOSIS — O368190 Decreased fetal movements, unspecified trimester, not applicable or unspecified: Secondary | ICD-10-CM | POA: Insufficient documentation

## 2012-03-11 DIAGNOSIS — O4700 False labor before 37 completed weeks of gestation, unspecified trimester: Secondary | ICD-10-CM | POA: Insufficient documentation

## 2012-03-11 LAB — URINALYSIS, ROUTINE W REFLEX MICROSCOPIC
Glucose, UA: NEGATIVE mg/dL
Leukocytes, UA: NEGATIVE
Specific Gravity, Urine: 1.02 (ref 1.005–1.030)
pH: 8 (ref 5.0–8.0)

## 2012-03-11 LAB — WET PREP, GENITAL
Clue Cells Wet Prep HPF POC: NONE SEEN
Trich, Wet Prep: NONE SEEN
Yeast Wet Prep HPF POC: NONE SEEN

## 2012-03-11 MED ORDER — TERBUTALINE SULFATE 1 MG/ML IJ SOLN
INTRAMUSCULAR | Status: AC
  Filled 2012-03-11: qty 1

## 2012-03-11 MED ORDER — LACTATED RINGERS IV BOLUS (SEPSIS)
1000.0000 mL | Freq: Once | INTRAVENOUS | Status: AC
  Administered 2012-03-11: 1000 mL via INTRAVENOUS

## 2012-03-11 MED ORDER — TERBUTALINE SULFATE 1 MG/ML IJ SOLN
0.2500 mg | Freq: Once | INTRAMUSCULAR | Status: AC
  Administered 2012-03-11: 0.25 mg via SUBCUTANEOUS

## 2012-03-11 NOTE — MAU Note (Signed)
Patient states she has had less fetal movement than usual for the past 2 days. Has been having some cramping and more thick mucus today. Has had a sore throat for 3-4 days.

## 2012-03-11 NOTE — MAU Note (Signed)
Patient is in with c/o sore throat for decreased fetal movement, a few days and increase in mucus vaginal discharge and vaginal irritation. She denies any vaginal bleeding, c/o cramping last night. Last intercourse 4 days ago per patient.

## 2012-03-11 NOTE — Progress Notes (Signed)
Dr Senaida Ores notified of patient feeling more pain with the contractions. Dr Senaida Ores reviewed tracing and is ordering terbutaline 0.25mg  sq x 2 doses.

## 2012-03-11 NOTE — MAU Provider Note (Signed)
History     CSN: [id number]  Arrival date and time: 03/11/12 1706   First Provider Initiated Contact with Patient 03/11/12 1750      Chief Complaint  Patient presents with  . Decreased Fetal Movement  . Sore Throat   HPI Brenda Joyce is 21 y.o. G2P0101 [redacted]w[redacted]d weeks presenting with increased discharge ?mucous plug and decreased fetal movement since last night.  She has a past history of a preterm delivery at 25 weeks--PROM.  She reports some right sided abdominal pain that she thinks may be Braxton-Hicks.   Past Medical History  Diagnosis Date  . Asthma   . Allergy   . Scoliosis   . Melanoma     right wrist    Past Surgical History  Procedure Date  . Tonsillectomy   . Tonsillectomy and adenoidectomy 2006    Family History  Problem Relation Age of Onset  . Anesthesia problems Neg Hx   . Cancer Mother     stomach cancer  . Hypertension Mother   . Muscular dystrophy Mother   . Mental illness Father     bipolar, schizoprenia  . Cancer Sister     cervical  . Diabetes Maternal Grandmother   . Diabetes Maternal Grandfather   . Diabetes Paternal Grandmother   . Diabetes Paternal Grandfather     History  Substance Use Topics  . Smoking status: Former Smoker    Types: Cigarettes  . Smokeless tobacco: Never Used  . Alcohol Use: No    Allergies: No Known Allergies  Prescriptions prior to admission  Medication Sig Dispense Refill  . albuterol (PROVENTIL HFA;VENTOLIN HFA) 108 (90 BASE) MCG/ACT inhaler Inhale 2 puffs into the lungs every 6 (six) hours as needed. For shortness of breath      . beclomethasone (QVAR) 40 MCG/ACT inhaler Inhale 2 puffs into the lungs 2 (two) times daily.       . cetirizine (ZYRTEC) 10 MG tablet Take 10 mg by mouth daily.       . fluticasone (VERAMYST) 27.5 MCG/SPRAY nasal spray Place 2 sprays into the nose daily.       . hydroxyprogesterone caproate (DELALUTIN) 250 mg/mL OIL Inject 250 mg into the muscle once. Injection every Wednesday       . Prenatal Vit-Fe Fumarate-FA (PRENATAL MULTIVITAMIN) TABS Take 1 tablet by mouth daily.        Review of Systems  Constitutional: Negative for fever and chills.  Respiratory: Negative.   Cardiovascular: Negative.   Gastrointestinal: Positive for abdominal pain (cramping ? contractions .  Decreased fetal movement.).  Genitourinary:       + mucous   Physical Exam   Blood pressure 111/73, pulse 98, temperature 98.8 F (37.1 C), temperature source Oral, resp. rate 16, height 5\' 3"  (1.6 m), weight 66.769 kg (147 lb 3.2 oz), last menstrual period 08/06/2011, SpO2 100.00%.  Physical Exam  Constitutional: She is oriented to person, place, and time. She appears well-developed and well-nourished. No distress.  HENT:  Head: Normocephalic.  Neck: Normal range of motion.  Cardiovascular: Normal rate.   Respiratory: Effort normal.  GI: Soft. There is no tenderness.  Genitourinary: Uterus is enlarged. Uterus is not tender. Cervix exhibits no discharge. There is tenderness around the vagina. No erythema or bleeding around the vagina. Vaginal discharge (white without odor) found.       Cervical exam by [facility], RN internal os is closed, external os fingertip.  Posterior thick and soft.  Neurological: She is alert and oriented  to person, place, and time.  Skin: Skin is warm and dry.  Psychiatric: She has a normal mood and affect. Her behavior is normal.   Results for orders placed during the hospital encounter of 03/11/12 (from the past 24 hour(s))  URINALYSIS, ROUTINE W REFLEX MICROSCOPIC     Status: Normal   Collection Time   03/11/12  5:30 PM      Component Value Range   Color, Urine YELLOW  YELLOW   APPearance CLEAR  CLEAR   Specific Gravity, Urine 1.020  1.005 - 1.030   pH 8.0  5.0 - 8.0   Glucose, UA NEGATIVE  NEGATIVE mg/dL   Hgb urine dipstick NEGATIVE  NEGATIVE   Bilirubin Urine NEGATIVE  NEGATIVE   Ketones, ur NEGATIVE  NEGATIVE mg/dL   Protein, ur NEGATIVE  NEGATIVE mg/dL    Urobilinogen, UA 0.2  0.0 - 1.0 mg/dL   Nitrite NEGATIVE  NEGATIVE   Leukocytes, UA NEGATIVE  NEGATIVE  WET PREP, GENITAL     Status: Abnormal   Collection Time   03/11/12  6:05 PM      Component Value Range   Yeast Wet Prep HPF POC NONE SEEN  NONE SEEN   Trich, Wet Prep NONE SEEN  NONE SEEN   Clue Cells Wet Prep HPF POC NONE SEEN  NONE SEEN   WBC, Wet Prep HPF POC FEW (*) NONE SEEN  FETAL FIBRONECTIN     Status: Normal   Collection Time   03/11/12  6:50 PM      Component Value Range   Fetal Fibronectin NEGATIVE  NEGATIVE   MAU Course  Procedures FMS baseline 140 reactive.  Mod variability.  Uterine irritability with contractions 1-5 minutes apart. MDM 18:07  Reported MSE to Dr. Senaida Ores.  Labs and exam findings discussed.  Order given to sent wetprep, do not send FFn (cx is closed), and give 1 liter of LR bolus 19:50  FFN sent because patient's internal os closed but external FP.  Conts with mild contractions.   IV infusing.   Call into Dr. Senaida Ores to report.  She is in delivery.   20:00 Dr. Senaida Ores returned call.  She had reviewed strip and called [facility] with order to give Terbutaline.    20:00 Care turned over to V. Katrinka Blazing, CNM  Assessment and Plan    KEY,EVE M 03/11/2012, 7:50 PM

## 2012-04-12 ENCOUNTER — Inpatient Hospital Stay (HOSPITAL_COMMUNITY)
Admission: AD | Admit: 2012-04-12 | Discharge: 2012-04-12 | DRG: 379 | Disposition: A | Discharge status: Home / Self Care | Payer: BC Managed Care – PPO | Source: Ambulatory Visit | Attending: Obstetrics and Gynecology | Admitting: Obstetrics and Gynecology

## 2012-04-12 DIAGNOSIS — O4700 False labor before 37 completed weeks of gestation, unspecified trimester: Principal | ICD-10-CM | POA: Diagnosis present

## 2012-04-12 LAB — URINALYSIS, ROUTINE W REFLEX MICROSCOPIC
Glucose, UA: NEGATIVE mg/dL
Hgb urine dipstick: NEGATIVE
Leukocytes, UA: NEGATIVE
Protein, ur: NEGATIVE mg/dL
Specific Gravity, Urine: >1.03 — ABNORMAL HIGH (ref 1.005–1.030)
pH: 5.5 (ref 5.0–8.0)

## 2012-04-12 LAB — AMNISURE RUPTURE OF MEMBRANE (ROM) NOT AT ARMC: Amnisure ROM: NEGATIVE

## 2012-04-12 NOTE — MAU Note (Signed)
Patient states she had diarrhea yesterday, none today. Has had abdominal cramping off and on. States she had leaking of clear fluid after her bath last night at 2300. Has had an episode this morning of leaking but not actively leaking in triage and not wearing a pad. Reports good fetal movement.

## 2012-04-12 NOTE — Progress Notes (Signed)
Pt up to walk on unit with FOB   

## 2012-04-12 NOTE — MAU Note (Signed)
Patient will be triaged in room 163. Spoke with Carollee Herter on BS and given report.

## 2012-04-26 ENCOUNTER — Inpatient Hospital Stay (HOSPITAL_COMMUNITY): Payer: BC Managed Care – PPO | Admitting: Anesthesiology

## 2012-04-26 ENCOUNTER — Inpatient Hospital Stay (HOSPITAL_COMMUNITY)
Admission: AD | Admit: 2012-04-26 | Discharge: 2012-04-27 | DRG: 372 | Disposition: A | Discharge status: Home / Self Care | Payer: BC Managed Care – PPO | Source: Ambulatory Visit | Attending: Obstetrics and Gynecology | Admitting: Obstetrics and Gynecology

## 2012-04-26 ENCOUNTER — Encounter (HOSPITAL_COMMUNITY): Payer: Self-pay | Admitting: Anesthesiology

## 2012-04-26 ENCOUNTER — Encounter (HOSPITAL_COMMUNITY): Payer: Self-pay

## 2012-04-26 DIAGNOSIS — O4590 Premature separation of placenta, unspecified, unspecified trimester: Secondary | ICD-10-CM | POA: Diagnosis present

## 2012-04-26 LAB — CBC
HCT: 35.2 % — ABNORMAL LOW (ref 36.0–46.0)
Hemoglobin: 11.7 g/dL — ABNORMAL LOW (ref 12.0–15.0)
WBC: 13.9 10*3/uL — ABNORMAL HIGH (ref 4.0–10.5)

## 2012-04-26 MED ORDER — LACTATED RINGERS IV SOLN: 500.0000 mL | Freq: Once | INTRAVENOUS | Status: DC

## 2012-04-26 MED ORDER — OXYTOCIN BOLUS FROM INFUSION
500.0000 mL | Freq: Once | INTRAVENOUS | Status: DC
  Filled 2012-04-26: qty 500

## 2012-04-26 MED ORDER — IBUPROFEN 600 MG PO TABS
600.0000 mg | ORAL_TABLET | Freq: Four times a day (QID) | ORAL | Status: DC
  Administered 2012-04-26 – 2012-04-27 (×5): 600 mg via ORAL
  Filled 2012-04-26 (×5): qty 1

## 2012-04-26 MED ORDER — PRENATAL MULTIVITAMIN CH: 1.0000 | ORAL_TABLET | Freq: Every day | ORAL | Status: DC

## 2012-04-26 MED ORDER — SENNOSIDES-DOCUSATE SODIUM 8.6-50 MG PO TABS
2.0000 | ORAL_TABLET | Freq: Every day | ORAL | Status: DC
  Administered 2012-04-26: 2 via ORAL

## 2012-04-26 MED ORDER — INFLUENZA VIRUS VACC SPLIT PF IM SUSP
0.5000 mL | INTRAMUSCULAR | Status: AC
  Administered 2012-04-27: 0.5 mL via INTRAMUSCULAR
  Filled 2012-04-26: qty 0.5

## 2012-04-26 MED ORDER — FLUTICASONE PROPIONATE 50 MCG/ACT NA SUSP
2.0000 | Freq: Every day | NASAL | Status: DC
  Administered 2012-04-26 – 2012-04-27 (×2): 2 via NASAL
  Filled 2012-04-26: qty 16

## 2012-04-26 MED ORDER — ZOLPIDEM TARTRATE 5 MG PO TABS: 5.0000 mg | ORAL_TABLET | Freq: Every evening | ORAL | Status: DC | PRN

## 2012-04-26 MED ORDER — LANOLIN HYDROUS EX OINT: TOPICAL_OINTMENT | CUTANEOUS | Status: DC | PRN

## 2012-04-26 MED ORDER — FLUTICASONE FUROATE 27.5 MCG/SPRAY NA SUSP: 2.0000 | Freq: Every day | NASAL | Status: DC

## 2012-04-26 MED ORDER — LACTATED RINGERS IV SOLN
INTRAVENOUS | Status: DC
  Administered 2012-04-26: 03:00:00 via INTRAVENOUS

## 2012-04-26 MED ORDER — EPHEDRINE 5 MG/ML INJ
10.0000 mg | INTRAVENOUS | Status: DC | PRN
  Filled 2012-04-26: qty 4

## 2012-04-26 MED ORDER — PHENYLEPHRINE 40 MCG/ML (10ML) SYRINGE FOR IV PUSH (FOR BLOOD PRESSURE SUPPORT)
80.0000 ug | PREFILLED_SYRINGE | INTRAVENOUS | Status: DC | PRN
  Filled 2012-04-26: qty 5

## 2012-04-26 MED ORDER — WITCH HAZEL-GLYCERIN EX PADS: 1.0000 "application " | MEDICATED_PAD | CUTANEOUS | Status: DC | PRN

## 2012-04-26 MED ORDER — LIDOCAINE HCL (PF) 1 % IJ SOLN
INTRAMUSCULAR | Status: DC | PRN
  Administered 2012-04-26 (×3): 4 mL

## 2012-04-26 MED ORDER — LIDOCAINE HCL (PF) 1 % IJ SOLN: 30.0000 mL | INTRAMUSCULAR | Status: DC | PRN

## 2012-04-26 MED ORDER — SIMETHICONE 80 MG PO CHEW: 80.0000 mg | CHEWABLE_TABLET | ORAL | Status: DC | PRN

## 2012-04-26 MED ORDER — BENZOCAINE-MENTHOL 20-0.5 % EX AERO: 1.0000 "application " | INHALATION_SPRAY | CUTANEOUS | Status: DC | PRN

## 2012-04-26 MED ORDER — ONDANSETRON HCL 4 MG/2ML IJ SOLN: 4.0000 mg | INTRAMUSCULAR | Status: DC | PRN

## 2012-04-26 MED ORDER — LACTATED RINGERS IV SOLN: INTRAVENOUS | Status: AC

## 2012-04-26 MED ORDER — ONDANSETRON HCL 4 MG/2ML IJ SOLN: 4.0000 mg | Freq: Four times a day (QID) | INTRAMUSCULAR | Status: DC | PRN

## 2012-04-26 MED ORDER — ACETAMINOPHEN 325 MG PO TABS: 650.0000 mg | ORAL_TABLET | ORAL | Status: DC | PRN

## 2012-04-26 MED ORDER — FLUTICASONE PROPIONATE HFA 44 MCG/ACT IN AERO
2.0000 | INHALATION_SPRAY | Freq: Two times a day (BID) | RESPIRATORY_TRACT | Status: DC
  Administered 2012-04-26 – 2012-04-27 (×2): 2 via RESPIRATORY_TRACT
  Filled 2012-04-26: qty 10.6

## 2012-04-26 MED ORDER — LACTATED RINGERS IV SOLN: 500.0000 mL | INTRAVENOUS | Status: DC | PRN

## 2012-04-26 MED ORDER — CITRIC ACID-SODIUM CITRATE 334-500 MG/5ML PO SOLN: 30.0000 mL | ORAL | Status: DC | PRN

## 2012-04-26 MED ORDER — OXYCODONE-ACETAMINOPHEN 5-325 MG PO TABS: 1.0000 | ORAL_TABLET | ORAL | Status: DC | PRN

## 2012-04-26 MED ORDER — MEASLES, MUMPS & RUBELLA VAC ~~LOC~~ INJ
0.5000 mL | INJECTION | Freq: Once | SUBCUTANEOUS | Status: DC
  Filled 2012-04-26: qty 0.5

## 2012-04-26 MED ORDER — TETANUS-DIPHTH-ACELL PERTUSSIS 5-2.5-18.5 LF-MCG/0.5 IM SUSP: 0.5000 mL | Freq: Once | INTRAMUSCULAR | Status: DC

## 2012-04-26 MED ORDER — OXYCODONE-ACETAMINOPHEN 5-325 MG PO TABS
1.0000 | ORAL_TABLET | ORAL | Status: DC | PRN
Start: 2012-04-26 — End: 2012-04-27

## 2012-04-26 MED ORDER — DIPHENHYDRAMINE HCL 25 MG PO CAPS
25.0000 mg | ORAL_CAPSULE | Freq: Four times a day (QID) | ORAL | Status: DC | PRN
  Administered 2012-04-26: 25 mg via ORAL
  Filled 2012-04-26: qty 1

## 2012-04-26 MED ORDER — PHENYLEPHRINE 40 MCG/ML (10ML) SYRINGE FOR IV PUSH (FOR BLOOD PRESSURE SUPPORT)
80.0000 ug | PREFILLED_SYRINGE | INTRAVENOUS | Status: DC | PRN
  Administered 2012-04-26 (×2): 80 ug via INTRAVENOUS
  Filled 2012-04-26: qty 5

## 2012-04-26 MED ORDER — EPHEDRINE 5 MG/ML INJ: 10.0000 mg | INTRAVENOUS | Status: DC | PRN

## 2012-04-26 MED ORDER — PRENATAL MULTIVITAMIN CH
1.0000 | ORAL_TABLET | Freq: Every day | ORAL | Status: DC
  Administered 2012-04-26 – 2012-04-27 (×2): 1 via ORAL
  Filled 2012-04-26 (×2): qty 1

## 2012-04-26 MED ORDER — PNEUMOCOCCAL VAC POLYVALENT 25 MCG/0.5ML IJ INJ
0.5000 mL | INJECTION | INTRAMUSCULAR | Status: AC
  Administered 2012-04-27: 0.5 mL via INTRAMUSCULAR
  Filled 2012-04-26: qty 0.5

## 2012-04-26 MED ORDER — OXYTOCIN 40 UNITS IN LACTATED RINGERS INFUSION - SIMPLE MED
62.5000 mL/h | Freq: Once | INTRAVENOUS | Status: AC
  Administered 2012-04-26: 62.5 mL/h via INTRAVENOUS
  Filled 2012-04-26: qty 1000

## 2012-04-26 MED ORDER — DIBUCAINE 1 % RE OINT: 1.0000 "application " | TOPICAL_OINTMENT | RECTAL | Status: DC | PRN

## 2012-04-26 MED ORDER — FENTANYL 2.5 MCG/ML BUPIVACAINE 1/10 % EPIDURAL INFUSION (WH - ANES)
14.0000 mL/h | INTRAMUSCULAR | Status: DC
  Administered 2012-04-26: 14 mL/h via EPIDURAL
  Filled 2012-04-26 (×2): qty 60

## 2012-04-26 MED ORDER — DIPHENHYDRAMINE HCL 50 MG/ML IJ SOLN: 12.5000 mg | INTRAMUSCULAR | Status: DC | PRN

## 2012-04-26 MED ORDER — FLEET ENEMA 7-19 GM/118ML RE ENEM: 1.0000 | ENEMA | RECTAL | Status: DC | PRN

## 2012-04-26 MED ORDER — ALBUTEROL SULFATE HFA 108 (90 BASE) MCG/ACT IN AERS
2.0000 | INHALATION_SPRAY | Freq: Four times a day (QID) | RESPIRATORY_TRACT | Status: DC | PRN
  Filled 2012-04-26: qty 6.7

## 2012-04-26 MED ORDER — ONDANSETRON HCL 4 MG PO TABS: 4.0000 mg | ORAL_TABLET | ORAL | Status: DC | PRN

## 2012-04-26 MED ORDER — IBUPROFEN 600 MG PO TABS: 600.0000 mg | ORAL_TABLET | Freq: Four times a day (QID) | ORAL | Status: DC | PRN

## 2012-04-26 MED ORDER — OXYTOCIN 40 UNITS IN LACTATED RINGERS INFUSION - SIMPLE MED: 62.5000 mL/h | INTRAVENOUS | Status: DC | PRN

## 2012-04-26 NOTE — Progress Notes (Signed)
This note also relates to the following rows which could not be included: Pulse Rate - Cannot attach notes to unvalidated device data SpO2 - Cannot attach notes to unvalidated device data   Vacuum applied by Dr. Ambrose Mantle due to fetal bradycardia

## 2012-04-26 NOTE — Progress Notes (Signed)
Patient ID: Brenda Joyce, female   DOB: 11-20-1990, 21 y.o.   MRN: [id number] Pt has received an epidural and is contracting q 1 and 1/2 to 3 and 1/2 minutes apart. She has more than the average amount of bloody show. The cervix is 6-7 cm 100% effaced and the BOW is bulging with the vertex at - 3 station. I will not do an amniotomy at this time

## 2012-04-26 NOTE — Progress Notes (Signed)
Patient ID: Brenda Joyce, female   DOB: 1990-10-26, 21 y.o.   MRN: [id number] I was called to see the pt because after lying on her back the BP dropped and the FHR decelerated to < 100 for 3 minutes. The FHR recovered by proper positioning. The cervix was 8-9 cm so after a contraction AROM produced clear fluid and the vertex descended to a - 2 STATION.

## 2012-04-26 NOTE — Anesthesia Procedure Notes (Signed)
Epidural Patient location during procedure: OB Start time: 04/26/2012 2:34 AM  Staffing Performed by: anesthesiologist   Preanesthetic Checklist Completed: patient identified, site marked, surgical consent, pre-op evaluation, timeout performed, IV checked, risks and benefits discussed and monitors and equipment checked  Epidural Patient position: sitting Prep: site prepped and draped and DuraPrep Patient monitoring: continuous pulse ox and blood pressure Approach: midline Injection technique: LOR air  Needle:  Needle type: Tuohy  Needle gauge: 17 G Needle length: 9 cm and 9 Needle insertion depth: 5 cm cm Catheter type: closed end flexible Catheter size: 19 Gauge Catheter at skin depth: 10 cm Test dose: negative  Assessment Events: blood not aspirated, injection not painful, no injection resistance, negative IV test and no paresthesia  Additional Notes Discussed risk of headache, infection, bleeding, nerve injury and failed or incomplete block.  Patient voices understanding and wishes to proceed. Reason for block:procedure for pain

## 2012-04-26 NOTE — Progress Notes (Signed)
Pt up to bathroom with steady pt unable to void.  Pt assisted back to bed after pericare completed

## 2012-04-26 NOTE — Progress Notes (Signed)
Patient ID: ILYA ESS, female   DOB: Nov 12, 1990, 21 y.o.   MRN: [id number] Delivery note:  The pt became fully dilated and began pushing. I tried to keep the uterus displaced but in spite of that at 6:18 AM the FHR decelerated to < 90 BPM without good recovery so a Health and safety inspector Vac vacuum was placed and with no pop offs and one series of pushes the pt delivered a living infant ROA over an intact perineum. A good amount of blood came out as the vertex delivered suggesting a partial separation of the placenta. The nose and mouth were suctioned and then the body delivered without difficulty. After delivery the tone was poor and the baby did not cry immediately so the NICU team was called. They arrived at 2 and 1/2 minutes of age and for 1 minute prior to their arrival the baby had been crying and the tone had improved. The placenta was removed intact and the uterus was normal. There were no lacerations. EBL 300 cc's. There was a possible clot on one edge of the placenta. While pushing the maternal heart rate would go into the 150's.The infant was female and the Apgars were 4 at 1 minute and 9 at 5 minutes.

## 2012-04-26 NOTE — Anesthesia Postprocedure Evaluation (Signed)
  Anesthesia Post-op Note  Patient: Brenda Joyce  Procedure(s) Performed: * No procedures listed *  Patient Location: PACU and Mother/Baby  Anesthesia Type: Epidural  Level of Consciousness: awake, alert  and oriented  Airway and Oxygen Therapy: Patient Spontanous Breathing   Post-op Assessment: Patient's Cardiovascular Status Stable and Respiratory Function Stable  Post-op Vital Signs: stable  Complications: No apparent anesthesia complications

## 2012-04-26 NOTE — Anesthesia Preprocedure Evaluation (Signed)
Anesthesia Evaluation  Patient identified by MRN, date of birth, ID band Patient awake    Reviewed: Allergy & Precautions, H&P , NPO status , Patient's Chart, lab work & pertinent test results, reviewed documented beta blocker date and time   History of Anesthesia Complications Negative for: history of anesthetic complications  Airway Mallampati: II TM Distance: >3 FB Neck ROM: full    Dental  (+) Teeth Intact   Pulmonary asthma , former smoker,  breath sounds clear to auscultation        Cardiovascular negative cardio ROS  Rhythm:regular Rate:Normal     Neuro/Psych negative neurological ROS  negative psych ROS   GI/Hepatic negative GI ROS, Neg liver ROS,   Endo/Other  negative endocrine ROS  Renal/GU negative Renal ROS     Musculoskeletal   Abdominal   Peds  Hematology negative hematology ROS (+)   Anesthesia Other Findings   Reproductive/Obstetrics (+) Pregnancy                           Anesthesia Physical Anesthesia Plan  ASA: II  Anesthesia Plan: Epidural   Post-op Pain Management:    Induction:   Airway Management Planned:   Additional Equipment:   Intra-op Plan:   Post-operative Plan:   Informed Consent: I have reviewed the patients History and Physical, chart, labs and discussed the procedure including the risks, benefits and alternatives for the proposed anesthesia with the patient or authorized representative who has indicated his/her understanding and acceptance.     Plan Discussed with:   Anesthesia Plan Comments:         Anesthesia Quick Evaluation

## 2012-04-26 NOTE — H&P (Signed)
NAMEBRYNLYNN, Brenda Joyce               ACCOUNT NO.:  [id number]  MEDICAL RECORD NO.:  [id number]  LOCATION:  9175                          FACILITY:  [facility]  PHYSICIAN:  Malachi Pro. Ambrose Mantle, M.D. DATE OF BIRTH:  [birth year]  DATE OF ADMISSION:  04/26/2012 DATE OF DISCHARGE:                             HISTORY & PHYSICAL   PRESENT ILLNESS:  This is a 21 year old white female, para 0-1-0-1, gravida 2 with [facility] May 12, 2012, admitted with labor at 37 weeks and 5 days' gestation.  Blood group and type A positive, negative antibody, rubella immune, RPR nonreactive, urine culture negative, hepatitis B surface antigen negative, HIV negative, GC and Chlamydia negative, cystic fibrosis screen negative, quad screen was negative, 1- hour Glucola was normal at 90, and group B strep was negative.  The patient began her prenatal course at 17 weeks after having an ultrasound at 15 weeks.  She was treated with Delalutin throughout the pregnancy because of a history of preterm birth.  She came to the hospital with contractions and was found to be 3+ cm dilated and was admitted.  PAST MEDICAL HISTORY:  ADHD, history of asthma, multiple dysplastic nevi, melanoma at age 17.  She delivered in February 2011 at 25 weeks and 6 days.  She also was told by chiropractor that she has scoliosis.  PAST SURGICAL HISTORY:  Melanoma excision at age 43, tonsillectomy at age 10.  ALLERGIES:  She has no known allergies.  MEDICATIONS:  Prenatal vitamins, Flonase nasal spray, ProAir inhalation, aerosol inhaler, QVAR inhalation, and Zyrtec.  FAMILY HISTORY:  Sister has a history of cervical cancer.  Mother has high blood pressure and stomach cancer.  Maternal and paternal grandparents have diabetes.  SOCIAL HISTORY:  The patient denies alcohol, tobacco, and illicit substance abuse.  She has been in a relationship with the father of the baby.  PHYSICAL EXAMINATION:  VITAL SIGNS:  Temperature is 98.2, pulse  107, respirations 19, blood pressure 90/63. HEART:  Normal size and sounds.  No murmurs. LUNGS:  Clear to auscultation. ABDOMEN:  Soft.  Fundal height is appropriate for dates.  According to the admission nurse, the cervix was 3+ cm, 80%, vertex at a -2.  ADMITTING IMPRESSION:  Intrauterine pregnancy at 37 weeks and 5 days, active labor.  The patient is being prepared for an epidural.     Malachi Pro. Ambrose Mantle, M.D.     TFH/MEDQ  D:  04/26/2012  T:  04/26/2012  Job:  [id number]

## 2012-04-26 NOTE — MAU Note (Signed)
Notified Dr. Ambrose Mantle patient G2P1 [redacted]w[redacted]d, 2 cm in office today, in labor contractions every 2-5 strong,fhr reactive, cervix now 3.5/80/-1 intact patient very uncomfortable.

## 2012-04-26 NOTE — Progress Notes (Signed)
Called Dr. Ambrose Mantle to room to review strip and pt's vs, pulse.

## 2012-04-27 LAB — CBC
MCHC: 32.6 g/dL (ref 30.0–36.0)
RDW: 14.3 % (ref 11.5–15.5)

## 2012-04-27 NOTE — Progress Notes (Signed)
Patient ID: Brenda Joyce, female   DOB: 1991/01/17, 21 y.o.   MRN: [id number] #1 afebrile BP normal wants d/c

## 2012-04-27 NOTE — Discharge Summary (Signed)
NAMEAJIA, CHADDERDON               ACCOUNT NO.:  [id number]  MEDICAL RECORD NO.:  [id number]  LOCATION:  9131                          FACILITY:  [facility]  PHYSICIAN:  Malachi Pro. Ambrose Mantle, M.D. DATE OF BIRTH:  [birth year]  DATE OF ADMISSION:  04/26/2012 DATE OF DISCHARGE:  04/27/2012                              DISCHARGE SUMMARY   A 21 year old female, para 0-1-0-1, gravida 2 with [facility] May 12, 2012 admitted with labor at 37 weeks and 5 days gestation.  Blood group and type A positive, negative antibody, Rubella immune, RPR nonreactive, urine culture negative.  Hepatitis B surface antigen negative, HIV negative, GC and Chlamydia negative.  Cystic fibrosis screen negative. Quad screen negative.  One-hour Glucola 90.  Group B strep negative. The patient was treated with Delalutin throughout the pregnancy came to the hospital after an uncomplicated prenatal course with 3+ cm dilated and was admitted.  During labor, her blood pressure did drop for 3 minutes.  After lying supine on her back after an epidural, the heart rate recovered with proper positioning.  She progressed to full dilatation and began pushing.  I tried to keep the uterus displaced, but in spite of that at 6:18 a.m., the fetal heart rate decelerated to less than 90 beats per minute without good recovery, so Bell cup Mityvac vacuum was placed and with no pop offs, and 1 series of pushing during 1 contraction, the patient delivered a living female infant.  ROA open and intact perineum.  A good amount of blood came out of the vertex delivered suggesting a partial separation of the placenta.  The nose and mouth were suctioned, and then the body delivered without difficulty. After delivery the tone was poor and the baby did not cry immediately, so the NICU team was called.  They arrived at 2-1/2 minutes of age and 1 minute prior to their arrival the baby had been crying in the tone and improved.  Placenta was removed intact and the  uterus was normal.  There were no lacerations.  Blood loss 300 mL.  There was a possible clot on 1 edge of the placenta while pushing the maternal heart rate would go into the 150.  Apgars were 4 at 1 and 9 at 5 minutes.  Postpartum, the patient did very well.  Blood pressure remained normal.  She remained afebrile on the first postpartum day.  She requested discharge.  RPR was nonreactive.  Initial hemoglobin 11.7, hematocrit 35.2, white count 13,900, platelet count 247,000, followup hemoglobin was 10.0.  FINAL DIAGNOSES:  Intrauterine pregnancy at 37 weeks and 5 days delivered ROA, terminal fetal heart rate bradycardia.  OPERATION:  Vacuum assisted vaginal delivery.  FINAL CONDITION:  Improved.  INSTRUCTIONS:  Regular discharge instruction booklet.  She will be given the after visit summary.  She declines analgesics at discharge.  She is to resume all of her asthma medications and she will be advised to return to the office in 6 weeks for followup examination, but if she wants her baby circumcised, she will need to do that in the office within 2 weeks.  She declines analgesics.     Malachi Pro. Ambrose Mantle, M.D.  TFH/MEDQ  D:  04/27/2012  T:  04/27/2012  Job:  [id number]

## 2013-01-08 ENCOUNTER — Ambulatory Visit (INDEPENDENT_AMBULATORY_CARE_PROVIDER_SITE_OTHER): Payer: Medicaid Other | Admitting: Physician Assistant

## 2013-01-08 ENCOUNTER — Encounter: Payer: Self-pay | Admitting: Physician Assistant

## 2013-01-08 VITALS — BP 104/70 | HR 88 | Temp 97.0°F | Resp 20 | Ht 62.5 in | Wt 116.0 lb

## 2013-01-08 DIAGNOSIS — J45901 Unspecified asthma with (acute) exacerbation: Secondary | ICD-10-CM

## 2013-01-08 DIAGNOSIS — J4531 Mild persistent asthma with (acute) exacerbation: Secondary | ICD-10-CM

## 2013-01-08 DIAGNOSIS — J309 Allergic rhinitis, unspecified: Secondary | ICD-10-CM

## 2013-01-08 MED ORDER — AZELASTINE HCL 0.1 % NA SOLN: 1.0000 | Freq: Two times a day (BID) | NASAL | Status: AC

## 2013-01-08 MED ORDER — BECLOMETHASONE DIPROPIONATE 80 MCG/ACT IN AERS: 1.0000 | INHALATION_SPRAY | RESPIRATORY_TRACT | Status: DC | PRN

## 2013-01-08 NOTE — Progress Notes (Signed)
   Patient ID: Brenda Joyce MRN: [id number], DOB: [birth year], 21 y.o. Date of Encounter: 01/08/2013, 3:41 PM    Chief Complaint:  Chief Complaint  Patient presents with  . bad allergies/asthma  meds not helping     HPI: 22 y.o. year old female reports that "her allergies and asthma have been acting up."  Allergies: Some days her nose is runny-watery clear. Some days nose is stopped up. Has had a lot of sneezing. Using Zyrtec, Singulair, and Flonase.  Asthma: "Has gotten to the point that even walking to the bathroom causes wheezing" Uses QVar BID routinely. Also using Albuterol every 4 hours as needed. On other meds as above(including Singulair).   Home Meds: See attached medication section for any medications that were entered at today's visit. The computer does not put those onto this list.The following list is a list of meds entered prior to today's visit.   Current Outpatient Prescriptions on File Prior to Visit  Medication Sig Dispense Refill  . albuterol (PROVENTIL HFA;VENTOLIN HFA) 108 (90 BASE) MCG/ACT inhaler Inhale 2 puffs into the lungs every 6 (six) hours as needed. For shortness of breath      . beclomethasone (QVAR) 40 MCG/ACT inhaler Inhale 2 puffs into the lungs 2 (two) times daily.       . fluticasone (VERAMYST) 27.5 MCG/SPRAY nasal spray Place 2 sprays into the nose daily.       . Prenatal Vit-Fe Fumarate-FA (PRENATAL MULTIVITAMIN) TABS Take 1 tablet by mouth daily.       Singulair 10mg  QD Zyrtec 10mg  QD    Allergies: No Known Allergies    Review of Systems: See HPI for pertinent ROS. All other ROS negative.    Physical Exam: Blood pressure 104/70, pulse 88, temperature 97 F (36.1 C), temperature source Oral, resp. rate 20, height 5' 2.5" (1.588 m), weight 116 lb (52.617 kg), last menstrual period 12/21/2012, not currently breastfeeding., Body mass index is 20.87 kg/(m^2). General: WNWD WF. Appears in no acute distress. HEENT: Normocephalic, atraumatic, eyes  without discharge, sclera non-icteric, nares are without discharge. Bilateral auditory canals clear, TM's are without perforation, pearly grey and translucent with reflective cone of light bilaterally. Oral cavity moist, posterior pharynx without exudate, erythema, peritonsillar abscess, or post nasal drip.Minimal tenderness with percussion of frontal and maxillary sinuses.  Neck: Supple. No thyromegaly. No lymphadenopathy. Lungs: Clear bilaterally to auscultation without wheezes, rales, or rhonchi. Breathing is unlabored.NO wheezes at present. Clear with good air movement. Heart: Regular rhythm. No murmurs, rubs, or gallops. Msk:  Strength and tone normal for age. Extremities/Skin: Warm and dry.  Neuro: Alert and oriented X 3. Moves all extremities spontaneously. Gait is normal. CNII-XII grossly in tact. Psych:  Responds to questions appropriately with a normal affect.     ASSESSMENT AND PLAN:  22 y.o. year old female with  1. Asthma with acute exacerbation, mild persistent Will increase dose of QVar for now. Cont Albuterol Q 4 hours prn. Cont Singulair.  - beclomethasone (QVAR) 80 MCG/ACT inhaler; Inhale 1 puff into the lungs as needed.  Dispense: 1 Inhaler; Refill: 12  2. Allergic rhinitis Will add Astrelin. Cont Zyrtec, Sigulair, Flonase - azelastine (ASTELIN) 137 MCG/SPRAY nasal spray; Place 1 spray into the nose 2 (two) times daily. Use in each nostril as directed  Dispense: 30 mL; Refill: [address] [place], [place], [facility] 01/08/2013 3:41 PM

## 2013-01-09 ENCOUNTER — Telehealth: Payer: Self-pay | Admitting: Family Medicine

## 2013-01-09 MED ORDER — MONTELUKAST SODIUM 10 MG PO TABS: 10.0000 mg | ORAL_TABLET | Freq: Every day | ORAL | Status: DC

## 2013-01-09 NOTE — Telephone Encounter (Signed)
Medication refilled per protocol. 

## 2013-03-25 ENCOUNTER — Ambulatory Visit: Payer: Medicaid Other | Admitting: Physician Assistant

## 2013-06-18 ENCOUNTER — Ambulatory Visit (INDEPENDENT_AMBULATORY_CARE_PROVIDER_SITE_OTHER): Payer: Medicaid Other | Admitting: Physician Assistant

## 2013-06-18 ENCOUNTER — Encounter: Payer: Self-pay | Admitting: Physician Assistant

## 2013-06-18 VITALS — BP 108/70 | HR 96 | Temp 98.0°F | Resp 18 | Ht 62.5 in | Wt 116.0 lb

## 2013-06-18 DIAGNOSIS — Z331 Pregnant state, incidental: Secondary | ICD-10-CM

## 2013-06-18 DIAGNOSIS — N912 Amenorrhea, unspecified: Secondary | ICD-10-CM

## 2013-06-18 DIAGNOSIS — Z3490 Encounter for supervision of normal pregnancy, unspecified, unspecified trimester: Secondary | ICD-10-CM

## 2013-06-18 LAB — PREGNANCY, URINE: Preg Test, Ur: POSITIVE

## 2013-06-18 NOTE — Progress Notes (Signed)
Patient ID: Brenda Joyce MRN: [id number], DOB: [birth year], 22 y.o. Date of Encounter: 06/18/2013, 1:50 PM    Chief Complaint:  Chief Complaint  Patient presents with  . r/o pregnancy    missed menses, faint positives at home     HPI: 22 y.o. year old white female came in today to have an pregnancy test. Says that she has done about 5 pregnancy test at home. The initial ones were faintly positive but the last one that she did look more definitely positive. She is here for followup. Her last menstrual period began on 05/18/13. She says that was a regular period and not just spotting. She says that she is feeling fine and is having no symptoms of nausea et Karie Soda  She has one son. She was pregnant with him and she went [facility]. However she says that she has now moved to [place] and would like to see OB/GYN there.     Home Meds: See attached medication section for any medications that were entered at today's visit. The computer does not put those onto this list.The following list is a list of meds entered prior to today's visit.   Current Outpatient Prescriptions on File Prior to Visit  Medication Sig Dispense Refill  . albuterol (PROVENTIL HFA;VENTOLIN HFA) 108 (90 BASE) MCG/ACT inhaler Inhale 2 puffs into the lungs every 6 (six) hours as needed. For shortness of breath      . azelastine (ASTELIN) 137 MCG/SPRAY nasal spray Place 1 spray into the nose 2 (two) times daily. Use in each nostril as directed  30 mL  12  . beclomethasone (QVAR) 40 MCG/ACT inhaler Inhale 2 puffs into the lungs 2 (two) times daily.       . fluticasone (VERAMYST) 27.5 MCG/SPRAY nasal spray Place 2 sprays into the nose daily.       . montelukast (SINGULAIR) 10 MG tablet Take 1 tablet (10 mg total) by mouth at bedtime.  30 tablet  11  . Prenatal Vit-Fe Fumarate-FA (PRENATAL MULTIVITAMIN) TABS Take 1 tablet by mouth daily.       No current facility-administered medications on file prior to  visit.    Allergies: No Known Allergies    Review of Systems: See HPI for pertinent ROS. All other ROS negative.    Physical Exam: Blood pressure 108/70, pulse 96, temperature 98 F (36.7 C), temperature source Oral, resp. rate 18, height 5' 2.5" (1.588 m), weight 116 lb (52.617 kg), last menstrual period 05/18/2013, not currently breastfeeding., Body mass index is 20.87 kg/(m^2). General:  WNWD WF. Appears in no acute distress. Neck: Supple. No thyromegaly. No lymphadenopathy. Lungs: Clear bilaterally to auscultation without wheezes, rales, or rhonchi. Breathing is unlabored. Heart: Regular rhythm. No murmurs, rubs, or gallops. Msk:  Strength and tone normal for 22 age. Extremities/Skin: Warm and dry. No clubbing or cyanosis. No edema. No rashes or suspicious lesions. Neuro: Alert and oriented X 3. Moves all extremities spontaneously. Gait is normal. CNII-XII grossly in tact. Psych:  Responds to questions appropriately with a normal affect.   Results for orders placed in visit on 06/18/13  PREGNANCY, URINE      Result Value Range   Preg Test, Ur POS       ASSESSMENT AND PLAN:  22 y.o.  year old female with  1. Pregnancy I Reviewed her medication list. She states that the only medication she is currently taking is over-the-counter Claritin. I Told her to stop this. SHe does not smoke. She does not use  any alcohol or illicit drugs. I told her to continue to avoid all of these. I told her to start a prenatal vitamin immediately. He can take even a Centrum multivitamin or whatever prenatal vitamin her Ob recommended with her prior pregnancy. Discussed importance of starting today. Discussed that she needs the folic acid early in pregnancy to event neural tube defects. Given date of last menstrual period being 05/18/13, she is only about [redacted] weeks pregnant. - Ambulatory referral to Obstetrics / Gynecology I placed a comment on is her fault that she would like to establish with an OB/GYN in  the [place] area. 2. Amenorrhea - Pregnancy, urine   Signed, [address] [place], [place], [facility] 06/18/2013 1:50 PM

## 2014-01-07 ENCOUNTER — Encounter: Payer: BC Managed Care – PPO | Admitting: Obstetrics & Gynecology

## 2014-01-07 DIAGNOSIS — Z3480 Encounter for supervision of other normal pregnancy, unspecified trimester: Secondary | ICD-10-CM

## 2014-02-06 ENCOUNTER — Observation Stay: Payer: Self-pay

## 2014-02-12 IMAGING — US US OB COMP +14 WK
2 series · 12 of 28 positions shown · non-contrast
Comparison: none

[Series 1: us ob comp less 14 wks · 1 of 6 slices shown (1 of 2)]
[im 6/6]
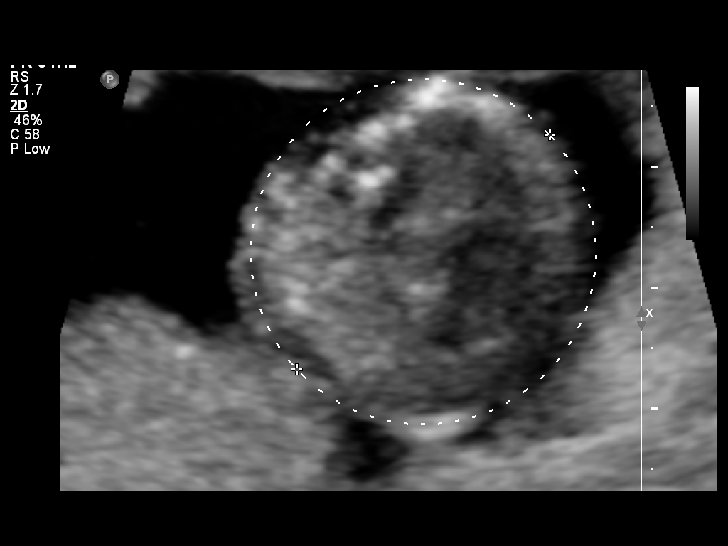

[Series 1: us ob comp less 14 wks · 11 of 65 slices shown (2 of 2)]
[im 3/65]
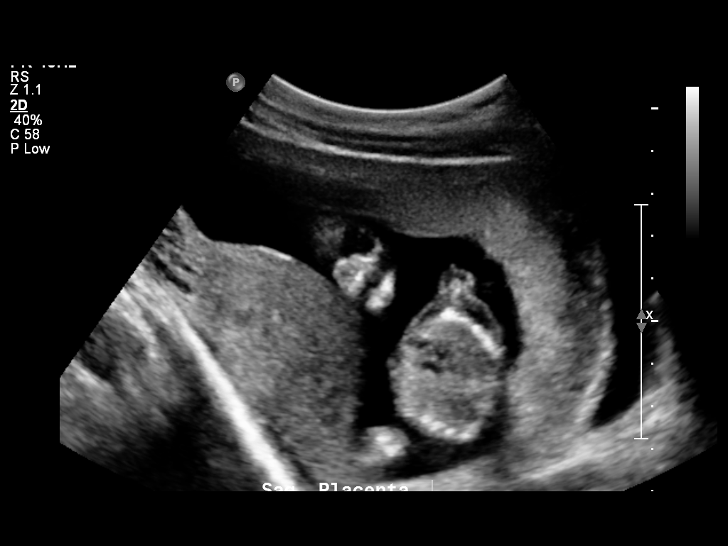
[im 8/65]
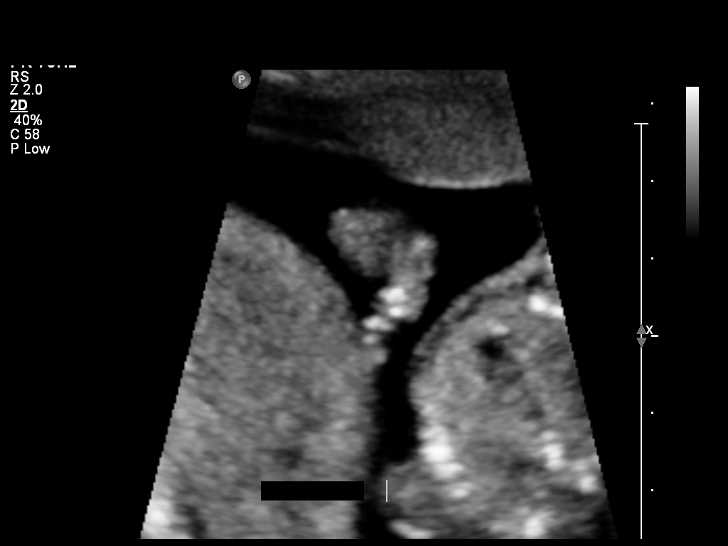
[im 16/65]
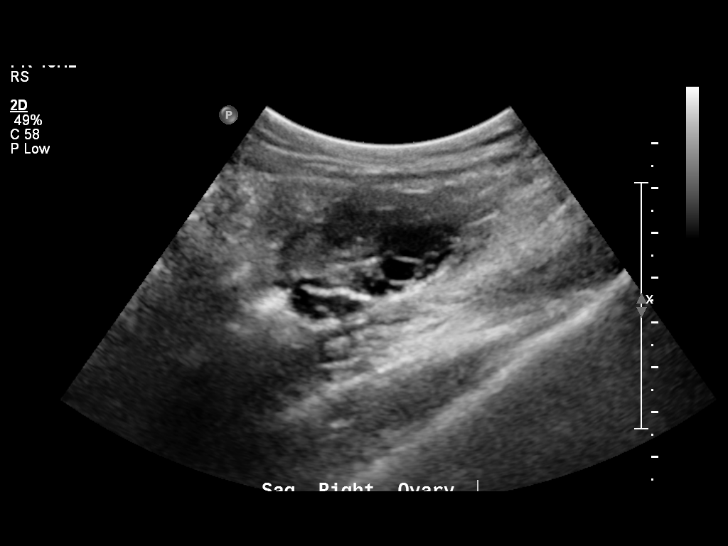
[im 21/65]
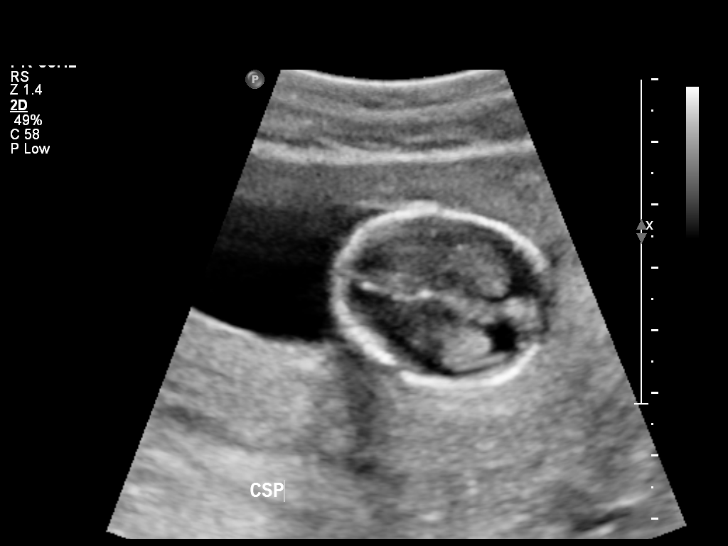
[im 26/65]
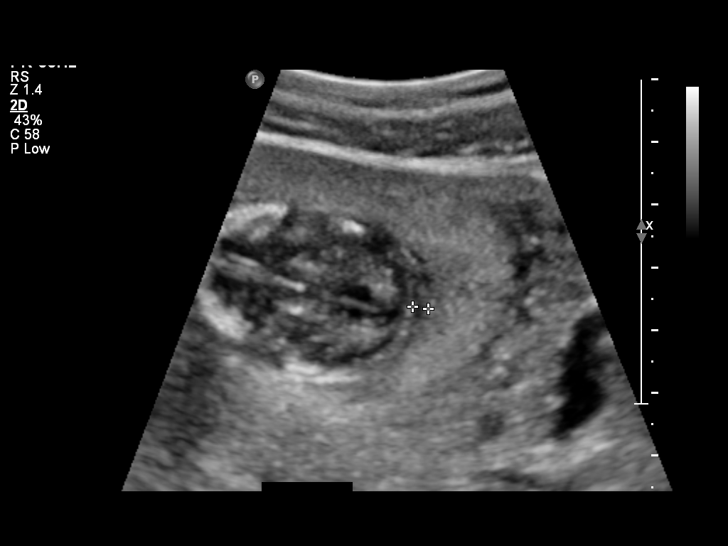
[im 34/65]
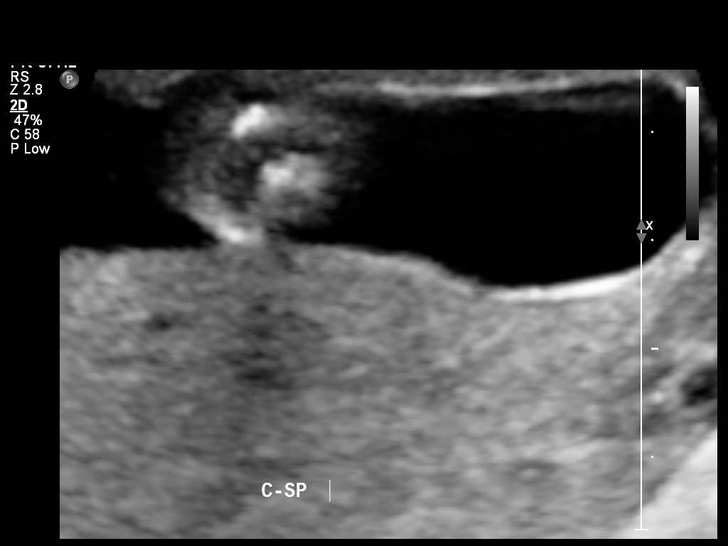
[im 39/65]
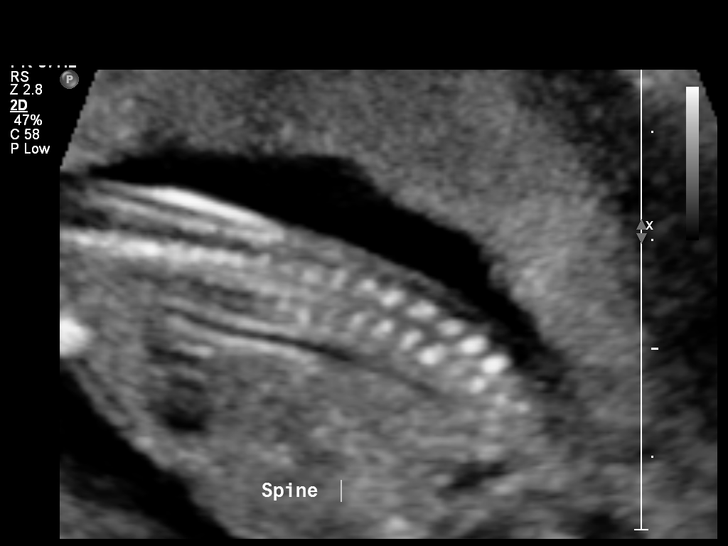
[im 44/65]
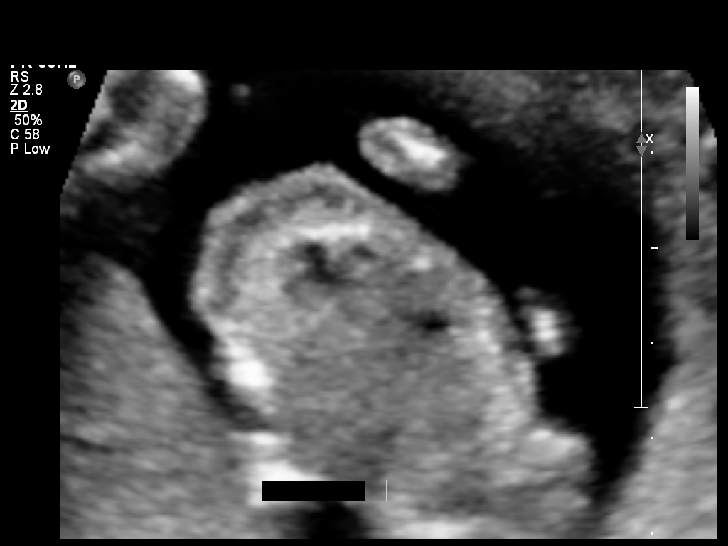
[im 52/65]
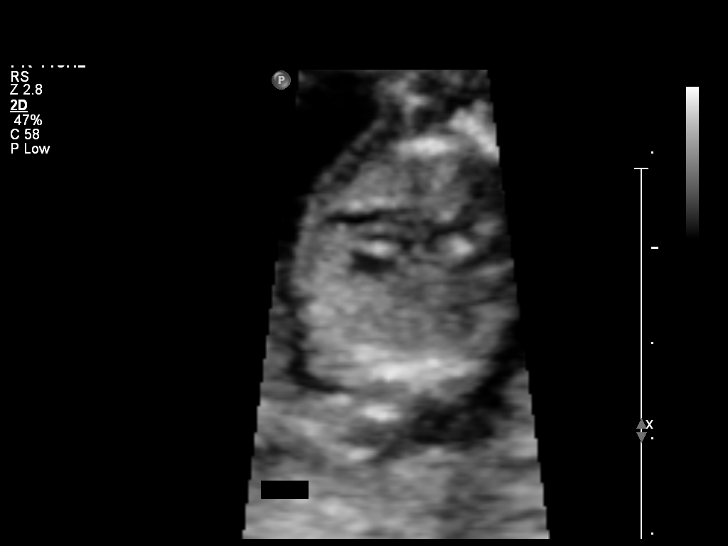
[im 57/65]
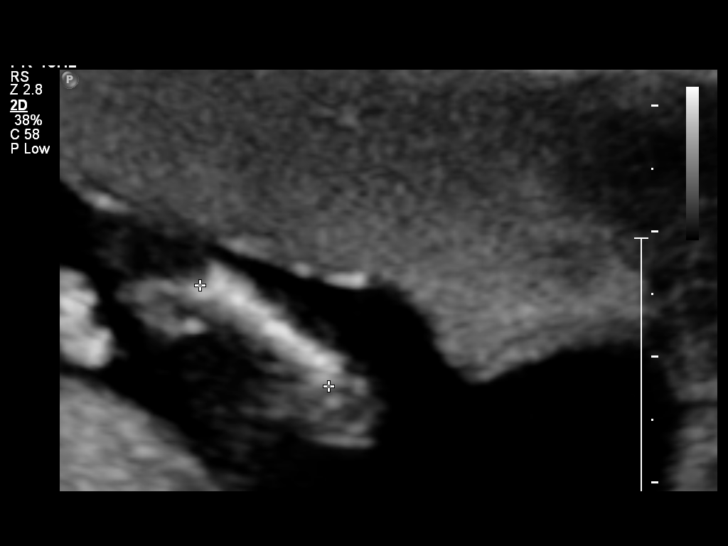
[im 62/65]
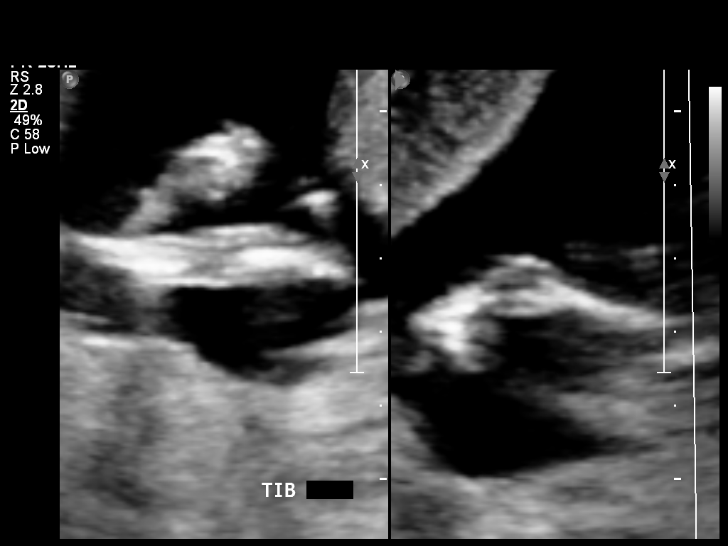

[12 of 28 positions shown; findings below may reference images not displayed]

OBSTETRICS REPORT
                      (Signed Final 11/15/2011 [DATE])

Procedures

 US OB COMP + 14 WK                                    76805.1
Indications

 Abdominal pain - generalized
 Poor obstetric history: Previous preterm delivery
 (24 wks)
Fetal Evaluation

 Fetal Heart Rate:  152                         bpm
 Cardiac Activity:  Observed
 Presentation:      Variable
 Placenta:          Anterior, above cervical os
 P. Cord            Visualized
 Insertion:

 Amniotic Fluid
 AFI FV:      Subjectively within normal limits
                                             Larg Pckt:     4.5  cm
Biometry

 BPD:     27.1  mm    G. Age:   14w 6d                CI:         68.9   70 - 86
                                                      FL/HC:
 HC:     104.3  mm    G. Age:   15w 0d                HC/AC:      1.14   1.14 -

 AC:      91.7  mm    G. Age:   15w 3d                FL/BPD:
 FL:        15  mm    G. Age:   14w 3d                FL/AC:      16.4   20 - 24
 HUM:       14  mm    G. Age:   13w 6d
 NFT:     2.47  mm

 Est. FW:     109  gm      0 lb 4 oz
Gestational Age

 LMP:           14w 3d       Date:   08/06/11                 EDD:   05/12/12
 U/S Today:     15w 0d                                        EDD:   05/08/12
 Best:          14w 3d    Det. By:   LMP  (08/06/11)          EDD:   05/12/12
Anatomy
 Cranium:           Appears normal      Aortic Arch:       Basic anatomy
                                                           exam per order
 Fetal Cavum:       Not well            Ductal Arch:       Basic anatomy
                    visualized                             exam per order
 Ventricles:        Appears normal      Diaphragm:         Not well
                                                           visualized
 Choroid Plexus:    Appears normal      Stomach:           Appears
                                                           normal, left
                                                           sided
 Cerebellum:        Appears normal      Abdomen:           Appears normal
 Posterior Fossa:   Appears normal      Abdominal Wall:    Appears nml
                                                           (cord insert,
                                                           abd wall)
 Nuchal Fold:       Not applicable      Cord Vessels:      Appears normal
                    (< 16 wks GA)                          (3 vessel cord)
 Face:              Lips appear         Kidneys:           Not well
                    normal (basic                          visualized
                    anatomy exam)
 Heart:             Not well            Bladder:           Appears normal
                    visualized
 RVOT:              Not well            Spine:             Not well
                    visualized                             visualized
 LVOT:              Not well            Limbs:             Four extremities
                    visualized                             visualized
                                                           (basic anatomy
                                                           exam)

 Other:     Technically difficult due to early GA and fetal position.
Cervix Uterus Adnexa

 Cervical Length:   3.18      cm

 Cervix:       Normal appearance by transabdominal scan.

 Left Ovary:   Within normal limits.
 Right Ovary:  Within normal limits.
 Adnexa:     No abnormality visualized.
Impression

 Single intrauterine gestation demonstrating an estimated
 gestational age by ultrasound of 15w 0d. This is correlated
 with expected estimated gestational age by LMP of 14w 3d.

 Overall anatomic evaluation is compromised by early
 gestational age and follow up evaluation is recommended at
 18- 19 weeks EGA. Visualized early anatomy appears normal.

 Subjectively and quantitatively normal amniotic fluid volume.

 Normal cervical length and appearance.

 questions or concerns.

## 2014-02-17 ENCOUNTER — Observation Stay: Payer: Self-pay

## 2014-02-23 ENCOUNTER — Inpatient Hospital Stay: Payer: Self-pay | Admitting: Obstetrics and Gynecology

## 2014-02-23 LAB — CBC WITH DIFFERENTIAL/PLATELET
BASOS ABS: 0 10*3/uL (ref 0.0–0.1)
Basophil %: 0.3 %
EOS ABS: 0.2 10*3/uL (ref 0.0–0.7)
Eosinophil %: 1.9 %
HCT: 33.4 % — ABNORMAL LOW (ref 35.0–47.0)
HGB: 10.9 g/dL — ABNORMAL LOW (ref 12.0–16.0)
LYMPHS PCT: 20.1 %
Lymphocyte #: 2 10*3/uL (ref 1.0–3.6)
MCH: 28.7 pg (ref 26.0–34.0)
MCHC: 32.6 g/dL (ref 32.0–36.0)
MCV: 88 fL (ref 80–100)
Monocyte #: 1.1 x10 3/mm — ABNORMAL HIGH (ref 0.2–0.9)
Monocyte %: 10.9 %
Neutrophil #: 6.6 10*3/uL — ABNORMAL HIGH (ref 1.4–6.5)
Neutrophil %: 66.8 %
Platelet: 258 10*3/uL (ref 150–440)
RBC: 3.78 10*6/uL — ABNORMAL LOW (ref 3.80–5.20)
RDW: 15.4 % — AB (ref 11.5–14.5)
WBC: 9.9 10*3/uL (ref 3.6–11.0)

## 2014-02-24 LAB — GC/CHLAMYDIA PROBE AMP

## 2014-02-25 LAB — HEMATOCRIT: HCT: 30.7 % — ABNORMAL LOW (ref 35.0–47.0)

## 2014-03-01 LAB — PATHOLOGY REPORT

## 2014-05-01 ENCOUNTER — Emergency Department: Payer: Self-pay | Admitting: Emergency Medicine

## 2014-05-14 ENCOUNTER — Ambulatory Visit (INDEPENDENT_AMBULATORY_CARE_PROVIDER_SITE_OTHER): Payer: BC Managed Care – PPO | Admitting: Family Medicine

## 2014-05-14 ENCOUNTER — Encounter: Payer: Self-pay | Admitting: Family Medicine

## 2014-05-14 VITALS — BP 100/80 | HR 82 | Temp 97.9°F | Resp 18 | Ht 63.0 in | Wt 133.0 lb

## 2014-05-14 DIAGNOSIS — G43001 Migraine without aura, not intractable, with status migrainosus: Secondary | ICD-10-CM

## 2014-05-14 DIAGNOSIS — J302 Other seasonal allergic rhinitis: Secondary | ICD-10-CM

## 2014-05-14 MED ORDER — PREDNISONE 20 MG PO TABS: ORAL_TABLET | ORAL | Status: DC

## 2014-05-14 NOTE — Progress Notes (Signed)
Subjective:    Patient ID: Brenda Joyce, female    DOB: 12-27-1990, 23 y.o.   MRN: [id number]  HPI Patient is a 23 year old white female who has a history of asthma currently managed with Qvar, and persistent allergies currently managed with astelin, veramyst, claritin, and singulair. She presents today complaining of severe uncontrolled allergies. She reports daily rhinorrhea, sinus congestion, sinus pain, postnasal drip. She is interested in seeing an allergist. She also reports daily headache which is situated in the occiput. She reports it to be 8 pounds he headache. She has photophobia and phonophobia with headache. She reports nausea with the headache. She has a family history of migraines and her sister. The headaches seem to be triggered by her allergies.  Past Medical History  Diagnosis Date  . Asthma   . Allergy   . Scoliosis   . Melanoma     right wrist   Past Surgical History  Procedure Laterality Date  . Tonsillectomy    . Tonsillectomy and adenoidectomy  2006   Current Outpatient Prescriptions on File Prior to Visit  Medication Sig Dispense Refill  . albuterol (PROVENTIL HFA;VENTOLIN HFA) 108 (90 BASE) MCG/ACT inhaler Inhale 2 puffs into the lungs every 6 (six) hours as needed. For shortness of breath      . azelastine (ASTELIN) 137 MCG/SPRAY nasal spray Place 1 spray into the nose 2 (two) times daily. Use in each nostril as directed  30 mL  12  . beclomethasone (QVAR) 40 MCG/ACT inhaler Inhale 2 puffs into the lungs 2 (two) times daily.       . fluticasone (VERAMYST) 27.5 MCG/SPRAY nasal spray Place 2 sprays into the nose daily.       Marland Kitchen loratadine (CLARITIN) 10 MG tablet Take 10 mg by mouth daily.      . montelukast (SINGULAIR) 10 MG tablet Take 1 tablet (10 mg total) by mouth at bedtime.  30 tablet  11  . Prenatal Vit-Fe Fumarate-FA (PRENATAL MULTIVITAMIN) TABS Take 1 tablet by mouth daily.       No current facility-administered medications on file prior to  visit.   No Known Allergies History   Social History  . Marital Status: Married    Spouse Name: N/A    Number of Children: N/A  . Years of Education: N/A   Occupational History  . Not on file.   Social History Main Topics  . Smoking status: Former Smoker    Types: Cigarettes  . Smokeless tobacco: Never Used  . Alcohol Use: No  . Drug Use: No  . Sexual Activity: Not Currently    Birth Control/ Protection: None   Other Topics Concern  . Not on file   Social History Narrative  . No narrative on file      Review of Systems  All other systems reviewed and are negative.      Objective:   Physical Exam  Vitals reviewed. Constitutional: She is oriented to person, place, and time. She appears well-developed and well-nourished. No distress.  HENT:  Right Ear: Tympanic membrane, external ear and ear canal normal.  Left Ear: Tympanic membrane, external ear and ear canal normal.  Nose: Mucosal edema and rhinorrhea present.  Mouth/Throat: Oropharynx is clear and moist. No oropharyngeal exudate.  Eyes: Conjunctivae are normal.  Cardiovascular: Normal rate, regular rhythm and normal heart sounds.   Pulmonary/Chest: Effort normal and breath sounds normal. No respiratory distress. She has no wheezes. She has no rales.  Neurological: She is alert and  oriented to person, place, and time. She has normal reflexes. She displays normal reflexes. No cranial nerve deficit. She exhibits normal muscle tone. Coordination normal.  Skin: No rash noted. She is not diaphoretic.          Assessment & Plan:  Seasonal allergies - Plan: predniSONE (DELTASONE) 20 MG tablet, Ambulatory referral to Allergy  Migraine without aura and with status migrainosus, not intractable   Begin prednisone 60 mg by mouth daily for days 1 and 2, 40 mg by mouth daily for days 3 and 4, 20 mg by mouth daily for days 5 and 6.  I will schedule the patient to see an allergist. I am hoping that the steroids,  allergies down temporarily and also help with her migraine headache. Recheck next week if no better.  Consider a CT scan of the head and sinuses if symptoms persist into next week to evaluate for chronic sinusitis.

## 2014-05-18 ENCOUNTER — Telehealth: Payer: Self-pay | Admitting: Family Medicine

## 2014-05-18 NOTE — Telephone Encounter (Signed)
[phone]   Pt states that her headaches are not getting any worse but they are not getting any better with the medications and she would like to have the CT scan done

## 2014-05-18 NOTE — Telephone Encounter (Signed)
Please schedule head ct without contrast.  If normal, I would recommend topamax for migraines.

## 2014-05-19 NOTE — Telephone Encounter (Signed)
Tried to call pt no answer and no vm.

## 2014-05-20 ENCOUNTER — Other Ambulatory Visit: Payer: Self-pay | Admitting: *Deleted

## 2014-05-20 DIAGNOSIS — G43001 Migraine without aura, not intractable, with status migrainosus: Secondary | ICD-10-CM

## 2014-05-22 NOTE — Telephone Encounter (Signed)
Call placed to patient. No answer. No VM.  

## 2014-06-04 ENCOUNTER — Telehealth: Payer: Self-pay | Admitting: *Deleted

## 2014-06-04 NOTE — Telephone Encounter (Signed)
FYI: [facility] to check to see if pt had come in for  her CT/MRI scan which was scheduled for Oct 9 at 4:30pm and pt never made appointment.

## 2014-06-14 ENCOUNTER — Encounter: Payer: Self-pay | Admitting: Family Medicine

## 2014-07-16 ENCOUNTER — Ambulatory Visit (INDEPENDENT_AMBULATORY_CARE_PROVIDER_SITE_OTHER): Payer: BC Managed Care – PPO | Admitting: Internal Medicine

## 2014-07-16 ENCOUNTER — Encounter: Payer: Self-pay | Admitting: Internal Medicine

## 2014-07-16 ENCOUNTER — Other Ambulatory Visit (INDEPENDENT_AMBULATORY_CARE_PROVIDER_SITE_OTHER): Payer: BC Managed Care – PPO

## 2014-07-16 VITALS — BP 120/66 | HR 102 | Ht 62.0 in | Wt 134.8 lb

## 2014-07-16 DIAGNOSIS — J302 Other seasonal allergic rhinitis: Secondary | ICD-10-CM

## 2014-07-16 DIAGNOSIS — J4530 Mild persistent asthma, uncomplicated: Secondary | ICD-10-CM

## 2014-07-16 DIAGNOSIS — J309 Allergic rhinitis, unspecified: Secondary | ICD-10-CM

## 2014-07-16 DIAGNOSIS — J3089 Other allergic rhinitis: Principal | ICD-10-CM

## 2014-07-16 MED ORDER — METHYLPREDNISOLONE ACETATE 80 MG/ML IJ SUSP
80.0000 mg | Freq: Once | INTRAMUSCULAR | Status: AC
  Administered 2014-07-16: 80 mg via INTRAMUSCULAR

## 2014-07-16 NOTE — Progress Notes (Signed)
07/16/14- 23 yoF never smoker referred courtesy of Dr Dennard Schaumann for allergy evaluation. Onset age 23 yo- perennial nasal congestion, drainage, throat itches. Managed w Flonase. Saw little help from several other nasal sprays, antihistamines, singulair. Asthma- mild intermittent- managed w Qvar 80, Proair (rescue inhaler every other day or so). Never hosp. Worse since birth of her third child 4 months ago. Tubes now tied. Triggers- smoke/ irritants, animals, dust, strong smells. Skin- occ itching red bumps- possibly hives- once or twice. No problem noted with insect stings, foods, latex, aspirin.  No ENT surgery.  Never allergy tested Family hx allergic rhinitis and asthma- father.  Her husband works Scientist, product/process development. She denies problems now with his work clothes.  Prior to Admission medications   Medication Sig Start Date End Date Taking? Authorizing Provider  albuterol (PROVENTIL HFA;VENTOLIN HFA) 108 (90 BASE) MCG/ACT inhaler Inhale 2 puffs into the lungs every 6 (six) hours as needed. For shortness of breath   Yes Historical Provider, MD  azelastine (ASTELIN) 137 MCG/SPRAY nasal spray Place 1 spray into the nose 2 (two) times daily. Use in each nostril as directed 01/08/13  Yes Orlena Sheldon, PA-C  beclomethasone (QVAR) 40 MCG/ACT inhaler Inhale 2 puffs into the lungs 2 (two) times daily.    Yes Historical Provider, MD  fluticasone (VERAMYST) 27.5 MCG/SPRAY nasal spray Place 2 sprays into the nose daily.    Yes Historical Provider, MD   Past Medical History  Diagnosis Date  . Asthma   . Allergy   . Scoliosis   . Melanoma     right wrist   Past Surgical History  Procedure Laterality Date  . Tonsillectomy    . Tonsillectomy and adenoidectomy  2006   Family History  Problem Relation Age of Onset  . Anesthesia problems Neg Hx   . Cancer Mother     stomach cancer  . Hypertension Mother   . Muscular dystrophy Mother   . Mental illness Father     bipolar, schizoprenia  . Cancer  Sister     cervical  . Diabetes Maternal Grandmother   . Diabetes Maternal Grandfather   . Diabetes Paternal Grandmother   . Diabetes Paternal Grandfather    History   Social History  . Marital Status: Married    Spouse Name: N/A    Number of Children: 3  . Years of Education: N/A   Occupational History  . unemployed    Social History Main Topics  . Smoking status: Former Smoker    Types: Cigarettes  . Smokeless tobacco: Never Used  . Alcohol Use: No  . Drug Use: No  . Sexual Activity: Not Currently    Birth Control/ Protection: None   Other Topics Concern  . Not on file   Social History Narrative   ROS-see HPI Constitutional:   No-   weight loss, night sweats, fevers, chills, fatigue, lassitude. HEENT:   No-  headaches, difficulty swallowing, tooth/dental problems, sore throat,       +sneezing, +itching, ear ache, +nasal congestion, +post nasal drip,  CV:  No-   chest pain, orthopnea, PND, swelling in lower extremities, anasarca,                                  dizziness, palpitations Resp: +shortness of breath with exertion or at rest.              No-   productive cough,  +  non-productive cough,  No- coughing up of blood.              No-   change in color of mucus.  +wheezing.   Skin: No-   rash or lesions. GI:  No-   heartburn, indigestion, abdominal pain, nausea, vomiting, diarrhea,                 change in bowel habits, loss of appetite GU: No-   dysuria, change in color of urine, no urgency or frequency.  No- flank pain. MS:  No-   joint pain or swelling.  No- decreased range of motion.  No- back pain. Neuro-     nothing unusual Psych:  No- change in mood or affect. No depression or anxiety.  No memory loss.  OBJ- Physical Exam General- Alert, Oriented, Affect-appropriate, Distress- none acute, WDWN Skin- rash-none, lesions- none, excoriation- none, + extensive tatoos Lymphadenopathy- none Head- atraumatic            Eyes- Gross vision intact, PERRLA,  conjunctivae and secretions clear            Ears- Hearing, canals-normal            Nose- Clear, no-Septal dev, mucus, polyps, erosion, perforation, +nasal                       crease            Throat- Mallampati II , mucosa clear , drainage- none, tonsils- atrophic Neck- flexible , trachea midline, no stridor , thyroid nl, carotid no bruit Chest - symmetrical excursion , unlabored           Heart/CV- RRR , no murmur , no gallop  , no rub, nl s1 s2                           - JVD- none , edema- none, stasis changes- none, varices- none           Lung- clear to P&A, wheeze+trace, cough- none , dullness-none, rub- none           Chest wall-  Abd- tender-no, distended-no, bowel sounds-present, HSM- no Br/ Gen/ Rectal- Not done, not indicated Extrem- cyanosis- none, clubbing, none, atrophy- none, strength- nl Neuro- grossly intact to observation

## 2014-07-16 NOTE — Patient Instructions (Signed)
Depo 58  Order- Schedule limited CT max fac   Chronic allergic sinusitis, ? Nasal polyps  Order- lab- Allergy profile, C BC w diff    Dx seasonal and perennial allergic rhinitis

## 2014-07-16 NOTE — Assessment & Plan Note (Signed)
Educated environmental triggers. Not clear why nasal congestion and nasal symptoms respond poorly to prednisone trials. Plan- Allergy profile, CBC w diff, limited CT sinuses to exclude polyps or chronic sinusitis, depomedrol inj for trial

## 2014-07-16 NOTE — Assessment & Plan Note (Signed)
Adequate control for now with Qvar and Proair.  Plan- Will need spirometry for documentation. May benefit from trial of Singulair. To be reconsidered next ov.

## 2014-07-17 LAB — CBC WITH DIFFERENTIAL/PLATELET
BASOS PCT: 0.4 % (ref 0.0–3.0)
Basophils Absolute: 0 10*3/uL (ref 0.0–0.1)
EOS PCT: 4.9 % (ref 0.0–5.0)
Eosinophils Absolute: 0.4 10*3/uL (ref 0.0–0.7)
HCT: 45.3 % (ref 36.0–46.0)
Hemoglobin: 14.6 g/dL (ref 12.0–15.0)
Lymphocytes Relative: 23.2 % (ref 12.0–46.0)
Lymphs Abs: 1.8 10*3/uL (ref 0.7–4.0)
MCHC: 32.1 g/dL (ref 30.0–36.0)
MCV: 90.5 fl (ref 78.0–100.0)
MONOS PCT: 7.2 % (ref 3.0–12.0)
Monocytes Absolute: 0.6 10*3/uL (ref 0.1–1.0)
NEUTROS PCT: 64.3 % (ref 43.0–77.0)
Neutro Abs: 5 10*3/uL (ref 1.4–7.7)
Platelets: 380 10*3/uL (ref 150.0–400.0)
RBC: 5 Mil/uL (ref 3.87–5.11)
RDW: 14.4 % (ref 11.5–15.5)
WBC: 7.7 10*3/uL (ref 4.0–10.5)

## 2014-07-19 ENCOUNTER — Ambulatory Visit (INDEPENDENT_AMBULATORY_CARE_PROVIDER_SITE_OTHER)
Admission: RE | Admit: 2014-07-19 | Discharge: 2014-07-19 | Disposition: A | Discharge status: Home / Self Care | Payer: BC Managed Care – PPO | Source: Ambulatory Visit | Attending: Internal Medicine | Admitting: Internal Medicine

## 2014-07-19 DIAGNOSIS — J309 Allergic rhinitis, unspecified: Secondary | ICD-10-CM

## 2014-07-19 LAB — ALLERGY FULL PROFILE
ALTERNARIA ALTERNATA: 0.3 kU/L — AB
Allergen, D pternoyssinus,d7: 2.23 kU/L — ABNORMAL HIGH
Allergen,Goose feathers, e70: 0.32 kU/L — ABNORMAL HIGH
Aspergillus fumigatus, m3: 0.23 kU/L — ABNORMAL HIGH
BERMUDA GRASS: 0.25 kU/L — AB
Bahia Grass: 0.25 kU/L — ABNORMAL HIGH
Box Elder IgE: <0.1 kU/L
CANDIDA ALBICANS: 0.26 kU/L — AB
CAT DANDER: 0.25 kU/L — AB
CURVULARIA LUNATA: 0.28 kU/L — AB
Common Ragweed: 0.19 kU/L — ABNORMAL HIGH
D. farinae: 0.79 kU/L — ABNORMAL HIGH
Dog Dander: 0.8 kU/L — ABNORMAL HIGH
Elm IgE: 0.25 kU/L — ABNORMAL HIGH
FESCUE: 0.28 kU/L — AB
G005 Rye, Perennial: 0.18 kU/L — ABNORMAL HIGH
G009 Red Top: 0.17 kU/L — ABNORMAL HIGH
GOLDENROD: 0.26 kU/L — AB
HELMINTHOSPORIUM HALODES: 0.15 kU/L — AB
House Dust Hollister: 0.47 kU/L — ABNORMAL HIGH
IGE (IMMUNOGLOBULIN E), SERUM: 391 kU/L — AB (ref <115)
LAMB'S QUARTERS CLASS: 0.25 kU/L — AB
Oak: 0.21 kU/L — ABNORMAL HIGH
Plantain: 0.24 kU/L — ABNORMAL HIGH
STEMPHYLIUM BOTRYOSUM: 0.16 kU/L — AB
Sycamore Tree: 0.22 kU/L — ABNORMAL HIGH
Timothy Grass: 0.19 kU/L — ABNORMAL HIGH

## 2014-07-22 ENCOUNTER — Ambulatory Visit (INDEPENDENT_AMBULATORY_CARE_PROVIDER_SITE_OTHER): Payer: BC Managed Care – PPO | Admitting: Family Medicine

## 2014-07-22 ENCOUNTER — Encounter: Payer: Self-pay | Admitting: Family Medicine

## 2014-07-22 VITALS — BP 128/62 | HR 76 | Temp 98.0°F | Resp 12 | Ht 62.0 in | Wt 137.0 lb

## 2014-07-22 DIAGNOSIS — L918 Other hypertrophic disorders of the skin: Secondary | ICD-10-CM

## 2014-07-22 NOTE — Progress Notes (Signed)
   Subjective:    Patient ID: Brenda Joyce, female    DOB: January 27, 1991, 23 y.o.   MRN: [id number]  HPI Patient has a growth on her right nostril. It is on the lateral margin of the opening. It is approximately 2-3 mm in size. Is well pedunculated with a small stalk.  The lesion bothers her and she cannot help but pick at the lesion. She would like removal. Past Medical History  Diagnosis Date  . Asthma   . Allergy   . Scoliosis   . Melanoma     right wrist   Past Surgical History  Procedure Laterality Date  . Tonsillectomy    . Tonsillectomy and adenoidectomy  2006   Current Outpatient Prescriptions on File Prior to Visit  Medication Sig Dispense Refill  . albuterol (PROVENTIL HFA;VENTOLIN HFA) 108 (90 BASE) MCG/ACT inhaler Inhale 2 puffs into the lungs every 6 (six) hours as needed. For shortness of breath    . azelastine (ASTELIN) 137 MCG/SPRAY nasal spray Place 1 spray into the nose 2 (two) times daily. Use in each nostril as directed 30 mL 12  . beclomethasone (QVAR) 40 MCG/ACT inhaler Inhale 2 puffs into the lungs 2 (two) times daily.     . fluticasone (VERAMYST) 27.5 MCG/SPRAY nasal spray Place 2 sprays into the nose daily.      No current facility-administered medications on file prior to visit.   No Known Allergies History   Social History  . Marital Status: Married    Spouse Name: N/A    Number of Children: 3  . Years of Education: N/A   Occupational History  . unemployed    Social History Main Topics  . Smoking status: Former Smoker    Types: Cigarettes  . Smokeless tobacco: Never Used  . Alcohol Use: No  . Drug Use: No  . Sexual Activity: Not Currently    Birth Control/ Protection: None   Other Topics Concern  . Not on file   Social History Narrative      Review of Systems  All other systems reviewed and are negative.      Objective:   Physical Exam  Cardiovascular: Normal rate and regular rhythm.   Pulmonary/Chest: Effort normal and  breath sounds normal.  Vitals reviewed.  2-3 mm skin tag on the lateral margin of the right nostril        Assessment & Plan:  Skin tag  Due to the pedunculated nature of the skin tag, no anesthesia was used. Using sterile technique, the skin tag was removed with sharp scissors.  Hemostasis was achieved with Drysol and Neosporin. The patient tolerated the procedure well without complication.

## 2014-09-17 ENCOUNTER — Ambulatory Visit (INDEPENDENT_AMBULATORY_CARE_PROVIDER_SITE_OTHER): Payer: Medicaid Other | Admitting: Internal Medicine

## 2014-09-17 ENCOUNTER — Encounter: Payer: Self-pay | Admitting: Internal Medicine

## 2014-09-17 VITALS — BP 110/78 | HR 76 | Ht 63.0 in | Wt 127.8 lb

## 2014-09-17 DIAGNOSIS — J4530 Mild persistent asthma, uncomplicated: Secondary | ICD-10-CM

## 2014-09-17 DIAGNOSIS — J309 Allergic rhinitis, unspecified: Secondary | ICD-10-CM

## 2014-09-17 DIAGNOSIS — J302 Other seasonal allergic rhinitis: Secondary | ICD-10-CM

## 2014-09-17 DIAGNOSIS — J3089 Other allergic rhinitis: Principal | ICD-10-CM

## 2014-09-17 MED ORDER — FLUTICASONE FUROATE-VILANTEROL 100-25 MCG/INH IN AEPB: 1.0000 | INHALATION_SPRAY | Freq: Every day | RESPIRATORY_TRACT | Status: AC

## 2014-09-17 MED ORDER — AZELASTINE-FLUTICASONE 137-50 MCG/ACT NA SUSP: 2.0000 | Freq: Every day | NASAL | Status: AC

## 2014-09-17 MED ORDER — AZELASTINE-FLUTICASONE 137-50 MCG/ACT NA SUSP: NASAL | Status: AC

## 2014-09-17 MED ORDER — FLUTICASONE FUROATE-VILANTEROL 100-25 MCG/INH IN AEPB: INHALATION_SPRAY | RESPIRATORY_TRACT | Status: AC

## 2014-09-17 NOTE — Progress Notes (Signed)
07/16/14- 70 yoF never smoker referred courtesy of Dr Dennard Schaumann for allergy evaluation. Onset age 24 yo- perennial nasal congestion, drainage, throat itches. Managed w Flonase. Saw little help from several other nasal sprays, antihistamines, singulair. Asthma- mild intermittent- managed w Qvar 80, Proair (rescue inhaler every other day or so). Never hosp. Worse since birth of her third child 4 months ago. Tubes now tied. Triggers- smoke/ irritants, animals, dust, strong smells. Skin- occ itching red bumps- possibly hives- once or twice. No problem noted with insect stings, foods, latex, aspirin.  No ENT surgery.  Never allergy tested Family hx allergic rhinitis and asthma- father.  Her husband works Scientist, product/process development. She denies problems now with his work clothes.  09/17/14- 43 yoF never smoker followed for allergy, asthma, urticaria FOLLOWS FOR: Review CT sinus and labs in detail with patient. Pt states her allergies started acting up again at the end of December 2015. Now feels well. Denies wheezing. We reviewed medication history: She says she failed to see her tach, Singulair, Allegra. Now using azelastine and fluticasone. Allergy Profile 07/16/14-total IgE 391 with elevations for almost all common allergens on the panel. CBC was normal without elevated eosinophil count. CT maxfac 07/20/15- IMPRESSION: Clear paranasal sinuses. Electronically Signed  By: Daryll Brod M.D.  On: 07/19/2014 16:25  ROS-see HPI Constitutional:   No-   weight loss, night sweats, fevers, chills, fatigue, lassitude. HEENT:   No-  headaches, difficulty swallowing, tooth/dental problems, sore throat,       +sneezing, +itching, ear ache, +nasal congestion, +post nasal drip,  CV:  No-   chest pain, orthopnea, PND, swelling in lower extremities, anasarca,                                  dizziness, palpitations Resp: +shortness of breath with exertion or at rest.              No-   productive cough,  + non-productive  cough,  No- coughing up of blood.              No-   change in color of mucus.  +wheezing.   Skin: No-   rash or lesions. GI:  No-   heartburn, indigestion, abdominal pain, nausea, vomiting,  GU:  MS:  No-   joint pain or swelling.  . Neuro-     nothing unusual Psych:  No- change in mood or affect. No depression or anxiety.  No memory loss.  OBJ- Physical Exam General- Alert, Oriented, Affect-appropriate, Distress- none acute, WDWN Skin- rash-none, lesions- none, excoriation- none, + extensive tatoos Lymphadenopathy- none Head- atraumatic            Eyes- Gross vision intact, PERRLA, conjunctivae and secretions clear            Ears- Hearing, canals-normal            Nose- Clear, no-Septal dev, mucus, polyps, erosion, perforation, +nasal                       crease            Throat- Mallampati II , mucosa clear , drainage- none, tonsils- atrophic Neck- flexible , trachea midline, no stridor , thyroid nl, carotid no bruit Chest - symmetrical excursion , unlabored           Heart/CV- RRR , no murmur , no gallop  , no rub, nl s1  s2                           - JVD- none , edema- none, stasis changes- none, varices- none           Lung- clear to P&A, wheeze+-none, cough- none , dullness-none, rub- none           Chest wall-  Abd-  Br/ Gen/ Rectal- Not done, not indicated Extrem- cyanosis- none, clubbing, none, atrophy- none, strength- nl Neuro- grossly intact to observation

## 2014-09-17 NOTE — Patient Instructions (Addendum)
Sample and script Breo Ellipta maintenance inhaler   1 puff, then rinse mouth, once daily    Try this every day instead of Qvar      You can still use your albuterol rescue inhaler as before, if needed.  Sample and script Dymista nasal spray    1-2 puffs each nostril once daily   Try this instead of the azelastine and fluticasone you are using now  Please call as needed

## 2014-09-21 NOTE — Assessment & Plan Note (Addendum)
She might be a good candidate for allergy vaccine but limited finances and she lives in [place], too difficult for her to get back and forth for allergy shots here. Plan-try sample Dymista for comparison

## 2014-09-21 NOTE — Assessment & Plan Note (Signed)
Now well controlled. She's had seasonal problems so we are going to try a stronger maintenance inhaler rather than simple steroid. Plan-try replacing Qvar with [employer]

## 2014-11-16 ENCOUNTER — Other Ambulatory Visit: Payer: Self-pay | Admitting: Family Medicine

## 2014-11-16 DIAGNOSIS — Z889 Allergy status to unspecified drugs, medicaments and biological substances status: Secondary | ICD-10-CM

## 2014-12-04 NOTE — Op Note (Signed)
PATIENT NAME:  Brenda Joyce, Brenda Joyce MR#:  [id number] DATE OF BIRTH:  1990-10-19  DATE OF PROCEDURE:  02/25/2014.  DATE OF PROCEDURE: 02/25/2014.   PREOPERATIVE DIAGNOSIS: A 24 year old G3, P2-1-0-3, postpartum day 1, desiring permanent surgical sterilization.   POSTOPERATIVE DIAGNOSIS: A 24 year old G3, P2-1-0-3, postpartum day 1, desiring permanent surgical sterilization.   PROCEDURE PERFORMED: Postpartum bilateral tubal ligation via Pomeroy method.   ANESTHESIA USED: General.   PREOPERATIVE ANTIBIOTICS: None.   PRIMARY SURGEON: Dorthula Nettles, MD.   ESTIMATED BLOOD LOSS: Minimal.   OPERATIVE FLUIDS: 450 mL.   DRAINS OR TUBES: None.   COMPLICATIONS: None.   INTRAOPERATIVE FINDINGS: Normal tubes and ovaries.   SPECIMENS REMOVED: Portions of right and left tubes.   PATIENT CONDITION FOLLOWING PROCEDURE: Stable.   PROCEDURE IN DETAIL: Risks, benefits, alternatives as well as the permanent nature of this sterilization were discussed with the patient in detail including alternative methods of contraception and the patient opted to proceed with the procedure. The patient was taken to the operating room where she was placed under general endotracheal anesthesia. She was positioned in the supine position, prepped and draped in the usual sterile fashion. A timeout was performed.   Attention was turned to the patient's umbilicus. The umbilicus was infiltrated with 0.5% Sensorcaine and a vertical incision was made at the base of the umbilicus approximately 2.5 cm in length. The subcutaneous tissue was dissected off the fascia using a hemostat. The fascia was grasped with a hemostat and then re-grasped with a 2nd hemostat. The first hemostat was released and re-grasped. The fascia was then incised using Mayo scissors. The fascial incision was extended by inserting the Mayo scissors and spreading the fascia. The peritoneal edge was then visualized, grasped in a similar way as the fascia had  been with 2 hemostats, and incised using Metzenbaum scissors.   Upon entry into the peritoneal cavity 2 [employer] retractors were used for visualization. The bowel and the omentum were packed out of the way using a mini lap which was introduced using Singley forceps. The patient was airplaned to her right. The left tube was identified, walked up to the fimbriated end before being walked back to the mid isthmic portion where it was grasped with a Babcock clamp. It was then doubly suture ligated using a 0-chromic wheel. The intervening knuckle of tube was excised using Metzenbaum scissors. Tubal ostia were clearly visualized to be hemostatic before being returned to the abdomen. The mini lap was removed.   The patient was airplaned to her left. The mini lap was replaced to once again get the omentum out of the way. The right tube was identified, walked out to the fimbriated end before being walked back to the mid isthmic portion where it was grasped a Babcock clamp.  It was then also doubly suture ligated using a 0-chromic wheel. The intervening knuckle of tube was excised using Metzenbaum scissors. Tubal ostia were clearly visualized to be hemostatic before being returned to the abdomen. The mini lap was removed.   The fascia was closed using a #1 Vicryl. Following this, the skin was closed using a 4-0 Monocryl in a subcuticular fashion. The incision was dressed with Dermabond. Sponge, needle, and instrument counts were correct x 2. The patient tolerated the procedure well and was taken to the recovery room in stable condition.   ____________________________ Stoney Bang. Georgianne Fick, MD ams:lt D: 02/25/2014 14:57:44 ET T: 02/25/2014 15:36:17 ET JOB#: [id number]  cc: Stoney Bang. Georgianne Fick, MD, <Dictator> [place]  Nyan Dufresne MD ELECTRONICALLY SIGNED 03/10/2014 6:07

## 2014-12-16 ENCOUNTER — Ambulatory Visit: Payer: Medicaid Other | Admitting: Internal Medicine

## 2014-12-21 NOTE — H&P (Signed)
L&D Evaluation:  History:  HPI 24 yo G3P1102 at [redacted]w[redacted]d by [facility] of 02/25/2014 presenting with contractions q5-49min.  +FM, no LOF, no VB.  Patient has been followed at [facility] clinic for history of prior 24 week delivery and manged with 17-P.  She also has a history of asthma on pumicort and albuterol   Presents with contractions   Patient's Medical History Asthma   Patient's Surgical History Tonsillectomy, wisdom teeth extraction   Medications Pre Natal Vitamins  pumicort and albuterol MDI   Allergies NKDA   Social History none   Family History Non-Contributory   ROS:  ROS All systems were reviewed.  HEENT, CNS, GI, GU, Respiratory, CV, Renal and Musculoskeletal systems were found to be normal.   Exam:  Vital Signs stable   Urine Protein not completed   General no apparent distress   Mental Status clear   Chest clear   Heart normal sinus rhythm   Abdomen gravid, non-tender   Estimated Fetal Weight Average for gestational age   Fetal Position vtx   Edema no edema   Pelvic no external lesions, 6cm   Mebranes Intact   FHT normal rate with no decels, 135, moderate, +accels, no decels   Ucx irregular, q5-60min   Skin dry, no lesions   Impression:  Impression 24 yo G3P1102 at [redacted]w[redacted]d presenting in active labor   Plan:  Plan EFM/NST, monitor contractions and for cervical change   Comments 1) Labor - admit for term labor  2) Fetus - reactive/cateogry I tracing     - 30 lbs weight gain this pregnancy     - pelvis tested to 6lbs 1oz  3) PNL - A positive / ABSC neg / RI / VZI / HBsAg neg / HIV neg / PRP NR / negative 1st trimester screen / 1-hr 89 / GBS negative 01/01/14  4) TDAP received 12/21/13  4) Asthma on ventolin MDI and pumicort  5) Breast / BTL - consent signed 11/02/13  6) Disposition pending delivery   Electronic Signatures: Dorthula Nettles (MD)  (Signed 14-Jul-15 23:07)  Authored: L&D Evaluation   Last Updated: 14-Jul-15 23:07 by  Dorthula Nettles (MD)

## 2015-10-30 ENCOUNTER — Emergency Department
Admission: EM | Admit: 2015-10-30 | Discharge: 2015-10-30 | Disposition: A | Discharge status: Home / Self Care | Payer: Medicaid Other | Attending: Emergency Medicine | Admitting: Emergency Medicine

## 2015-10-30 DIAGNOSIS — A6000 Herpesviral infection of urogenital system, unspecified: Secondary | ICD-10-CM

## 2015-10-30 DIAGNOSIS — A6009 Herpesviral infection of other urogenital tract: Secondary | ICD-10-CM | POA: Insufficient documentation

## 2015-10-30 DIAGNOSIS — Z3202 Encounter for pregnancy test, result negative: Secondary | ICD-10-CM | POA: Insufficient documentation

## 2015-10-30 DIAGNOSIS — Z87891 Personal history of nicotine dependence: Secondary | ICD-10-CM | POA: Insufficient documentation

## 2015-10-30 DIAGNOSIS — Z7951 Long term (current) use of inhaled steroids: Secondary | ICD-10-CM | POA: Insufficient documentation

## 2015-10-30 LAB — URINALYSIS COMPLETE WITH MICROSCOPIC (ARMC ONLY)
Bilirubin Urine: NEGATIVE
Glucose, UA: NEGATIVE mg/dL
Hgb urine dipstick: NEGATIVE
Ketones, ur: NEGATIVE mg/dL
Leukocytes, UA: NEGATIVE
Nitrite: NEGATIVE
Protein, ur: NEGATIVE mg/dL
Specific Gravity, Urine: 1.013 (ref 1.005–1.030)
pH: 8 (ref 5.0–8.0)

## 2015-10-30 LAB — POCT PREGNANCY, URINE: Preg Test, Ur: NEGATIVE

## 2015-10-30 MED ORDER — ACYCLOVIR 400 MG PO TABS: 400.0000 mg | ORAL_TABLET | Freq: Three times a day (TID) | ORAL | Status: AC
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
## 2015-10-30 NOTE — Discharge Instructions (Signed)
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
## 2017-02-27 ENCOUNTER — Emergency Department
Admission: EM | Admit: 2017-02-27 | Discharge: 2017-02-27 | Disposition: A | Discharge status: Home / Self Care | Payer: Medicaid Other | Attending: Emergency Medicine | Admitting: Emergency Medicine

## 2017-02-27 ENCOUNTER — Emergency Department: Payer: Medicaid Other

## 2017-02-27 ENCOUNTER — Encounter: Payer: Self-pay | Admitting: Emergency Medicine

## 2017-02-27 DIAGNOSIS — M546 Pain in thoracic spine: Secondary | ICD-10-CM | POA: Insufficient documentation

## 2017-02-27 DIAGNOSIS — R42 Dizziness and giddiness: Secondary | ICD-10-CM | POA: Insufficient documentation

## 2017-02-27 DIAGNOSIS — Z79899 Other long term (current) drug therapy: Secondary | ICD-10-CM | POA: Diagnosis not present

## 2017-02-27 DIAGNOSIS — J45909 Unspecified asthma, uncomplicated: Secondary | ICD-10-CM | POA: Insufficient documentation

## 2017-02-27 DIAGNOSIS — R55 Syncope and collapse: Secondary | ICD-10-CM | POA: Diagnosis present

## 2017-02-27 DIAGNOSIS — Z87891 Personal history of nicotine dependence: Secondary | ICD-10-CM | POA: Diagnosis not present

## 2017-02-27 DIAGNOSIS — M542 Cervicalgia: Secondary | ICD-10-CM | POA: Insufficient documentation

## 2017-02-27 LAB — URINALYSIS, COMPLETE (UACMP) WITH MICROSCOPIC
BILIRUBIN URINE: NEGATIVE
Bacteria, UA: NONE SEEN
GLUCOSE, UA: NEGATIVE mg/dL
KETONES UR: NEGATIVE mg/dL
Nitrite: NEGATIVE
Protein, ur: NEGATIVE mg/dL
Specific Gravity, Urine: 1.029 (ref 1.005–1.030)
pH: 5 (ref 5.0–8.0)

## 2017-02-27 LAB — BASIC METABOLIC PANEL
ANION GAP: 7 (ref 5–15)
BUN: 14 mg/dL (ref 6–20)
CALCIUM: 9.2 mg/dL (ref 8.9–10.3)
CO2: 25 mmol/L (ref 22–32)
CREATININE: 0.98 mg/dL (ref 0.44–1.00)
Chloride: 106 mmol/L (ref 101–111)
GFR calc Af Amer: >60 mL/min (ref >60)
GFR calc non Af Amer: >60 mL/min (ref >60)
Glucose, Bld: 89 mg/dL (ref 65–99)
Potassium: 4 mmol/L (ref 3.5–5.1)
SODIUM: 138 mmol/L (ref 135–145)

## 2017-02-27 LAB — CBC
HCT: 38.9 % (ref 35.0–47.0)
HEMOGLOBIN: 13.2 g/dL (ref 12.0–16.0)
MCH: 30.8 pg (ref 26.0–34.0)
MCHC: 33.8 g/dL (ref 32.0–36.0)
MCV: 90.9 fL (ref 80.0–100.0)
PLATELETS: 287 10*3/uL (ref 150–440)
RBC: 4.28 MIL/uL (ref 3.80–5.20)
RDW: 14 % (ref 11.5–14.5)
WBC: 10.7 10*3/uL (ref 3.6–11.0)

## 2017-02-27 LAB — POCT PREGNANCY, URINE: Preg Test, Ur: NEGATIVE

## 2017-02-27 MED ORDER — NAPROXEN 500 MG PO TABS: 500.0000 mg | ORAL_TABLET | Freq: Two times a day (BID) | ORAL | 0 refills | Status: AC

## 2017-02-27 MED ORDER — DIAZEPAM 2 MG PO TABS: 2.0000 mg | ORAL_TABLET | Freq: Three times a day (TID) | ORAL | 0 refills | Status: AC | PRN

## 2017-02-27 MED ORDER — IOPAMIDOL (ISOVUE-370) INJECTION 76%
125.0000 mL | Freq: Once | INTRAVENOUS | Status: AC | PRN
  Administered 2017-02-27: 125 mL via INTRAVENOUS
  Filled 2017-02-27: qty 125

## 2017-02-27 MED ORDER — SODIUM CHLORIDE 0.9 % IV BOLUS (SEPSIS)
1000.0000 mL | Freq: Once | INTRAVENOUS | Status: AC
  Administered 2017-02-27: 1000 mL via INTRAVENOUS

## 2017-02-27 NOTE — ED Triage Notes (Signed)
Pt coming in for syncopal episode yesterday and been having issues of dizziness and blacking out and waking up on the floor. Also been having severe neck and upper back pain x3 days.

## 2017-02-27 NOTE — ED Provider Notes (Signed)
[facility] Emergency Department Provider Note  ____________________________________________  Time seen: Approximately 9:58 PM  I have reviewed the triage vital signs and the nursing notes.   HISTORY  Chief Complaint Loss of Consciousness and Back Pain    HPI [place] B Blumenthal is a 26 y.o. female who complains of thoracic back pain in the paraspinous area bilaterallyradiating up into the bilateral neck and bilateral frontotemporal area. Associated with intermittent blurry vision and a few episodes of syncope over the last 3 days. Denies chest pain or shortness of breath. No sudden tearing pain. Never had anything like this before. No recent trauma. No recent illness. No fevers or chills. No abdominal pain. Pain is worse with movement, no alleviating factors. Sharp and severe.     Past Medical History:  Diagnosis Date  . Allergy   . Asthma   . 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 Scoliosis      Patient Active Problem List   Diagnosis Date Noted  . 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 Asthma Mild persistent  without complication 99/83/3825  . Seasonal and perennial allergic rhinitis 12/03/2011  . Scoliosis 12/03/2011  . Supervision of high-risk pregnancy 12/03/2011  . History of preterm delivery, currently pregnant 11/15/2011     Past Surgical History:  Procedure Laterality Date  . TONSILLECTOMY    . TONSILLECTOMY AND ADENOIDECTOMY  2006     Prior to Admission medications   Medication Sig Start Date End Date Taking? Authorizing Provider  albuterol (PROVENTIL HFA;VENTOLIN HFA) 108 (90 BASE) MCG/ACT inhaler Inhale 2 puffs into the lungs every 6 (six) hours as needed. For shortness of breath    [provider]  azelastine (ASTELIN) 137 MCG/SPRAY nasal spray Place 1 spray into the nose 2 (two) times daily. Use in each nostril as directed 01/08/13   Orlena Sheldon, PA-C  Azelastine-Fluticasone [facility]) 137-50 MCG/ACT SUSP 1-2 puffs each  nostril once daily 09/17/14   Baird Lyons D, MD  Azelastine-Fluticasone [facility]) 137-50 MCG/ACT SUSP Place 2 sprays into both nostrils at bedtime. 09/17/14 10/01/14  Deneise Lever, MD  beclomethasone (QVAR) 40 MCG/ACT inhaler Inhale 2 puffs into the lungs 2 (two) times daily.     [provider]  fluticasone (VERAMYST) 27.5 MCG/SPRAY nasal spray Place 2 sprays into the nose daily.     [provider]  Fluticasone Furoate-Vilanterol (BREO ELLIPTA) 100-25 MCG/INH AEPB Inhale 1 puff, once daily- maintenance 09/17/14   Deneise Lever, MD     Allergies Patient has no known allergies.   Family History  Problem Relation Age of Onset  . Cancer Mother        stomach cancer  . Hypertension Mother   . Muscular dystrophy Mother   . Mental illness Father        bipolar, schizoprenia  . Cancer Sister        cervical  . Diabetes Maternal Grandmother   . Diabetes Maternal Grandfather   . Diabetes Paternal Grandmother   . Diabetes Paternal Grandfather   . Anesthesia problems Neg Hx     Social History Social History  Substance Use Topics  . Smoking status: Former Smoker    Packs/day: 0.20    Years: 1.00    Types: Cigarettes    Quit date: 09/17/2009  . Smokeless tobacco: Never Used  . Alcohol use No    Review of Systems  Constitutional:   No fever or chills.  ENT:   No sore throat. No rhinorrhea.  Cardiovascular:   No chest pain Positive as above syncope. Respiratory:   No dyspnea or cough. Gastrointestinal:   Negative for abdominal pain, vomiting and diarrhea.  Musculoskeletal:   Negative for upper back pain and neck pain as above All other systems reviewed and are negative except as documented above in ROS and HPI.  ____________________________________________   PHYSICAL EXAM:  VITAL SIGNS: ED Triage Vitals  Enc Vitals Group     BP 02/27/17 1951 (!) 114/97     Pulse Rate 02/27/17 1951 92     Resp 02/27/17 1951 19     Temp 02/27/17 1951 98.4 F (36.9 C)      Temp Source 02/27/17 1951 Oral     SpO2 02/27/17 1951 100 %     Weight 02/27/17 1952 123 lb (55.8 kg)     Height 02/27/17 1952 5\' 3"  (1.6 m)     Head Circumference --      Peak Flow --      Pain Score 02/27/17 1949 4     Pain Loc --      Pain Edu? --      Excl. in [place]? --     Vital signs reviewed, nursing assessments reviewed.   Constitutional:   Alert and oriented. Well appearing and in no distress. Eyes:   No scleral icterus.  EOMI. No nystagmus. No conjunctival pallor. PERRL.Funduscopy normal with sharp optic discs, no pallor or hemorrhages, and spontaneous venous pulsations present. ENT   Head:   Normocephalic and atraumatic.   Nose:   No congestion/rhinnorhea.    Mouth/Throat:   MMM, no pharyngeal erythema. No peritonsillar mass.    Neck:   No meningismus. Full ROM. No midline C-spine tenderness. No bruit. There is tenderness in the paraspinous musculature bilaterally Hematological/Lymphatic/Immunilogical:   No cervical lymphadenopathy. Cardiovascular:   RRR. Symmetric bilateral radial and DP pulses.  No murmurs.  Respiratory:   Normal respiratory effort without tachypnea/retractions. Breath sounds are clear and equal bilaterally. No wheezes/rales/rhonchi. Gastrointestinal:   Soft and nontender. Non distended. There is no CVA tenderness.  No rebound, rigidity, or guarding. Genitourinary:   deferred Musculoskeletal:   Normal range of motion in all extremities. No joint effusions.  No lower extremity tenderness.  No edema. There is tenderness in the paraspinous musculature bilaterally in the thoracic back. No midline tenderness or step-offs. Neurologic:   Normal speech and language.  Motor grossly intact. No gross focal neurologic deficits are appreciated.  Skin:    Skin is warm, dry and intact. No rash noted.  No petechiae, purpura, or bullae.  ____________________________________________    LABS (pertinent positives/negatives) (all labs ordered are listed,  but only abnormal results are displayed) Labs Reviewed  URINALYSIS, COMPLETE (UACMP) WITH MICROSCOPIC - Abnormal; Notable for the following:       Result Value   Color, Urine YELLOW (*)    APPearance HAZY (*)    Hgb urine dipstick MODERATE (*)    Leukocytes, UA TRACE (*)    Squamous Epithelial / LPF 0-5 (*)    All other components within normal limits  BASIC METABOLIC PANEL  CBC  CBG MONITORING, ED  POC URINE PREG, ED  POCT PREGNANCY, URINE   ____________________________________________   EKG  Interpreted by me  Date: 02/27/2017  Rate: 82  Rhythm: normal sinus rhythm  QRS Axis: normal  Intervals: normal  ST/T Wave abnormalities: normal  Conduction Disutrbances: none  Narrative Interpretation: unremarkable      ____________________________________________    RADIOLOGY  Ct Head  Wo Contrast  Result Date: 02/27/2017 CLINICAL DATA:  Syncope yesterday EXAM: CT HEAD WITHOUT CONTRAST TECHNIQUE: Contiguous axial images were obtained from the base of the skull through the vertex without intravenous contrast. COMPARISON:  CT head 04/12/2011 FINDINGS: Brain: No evidence of acute infarction, hemorrhage, hydrocephalus, extra-axial collection or mass lesion/mass effect. Vascular: No hyperdense vessel or unexpected calcification. Skull: Negative Sinuses/Orbits: Negative Other: None IMPRESSION: Negative CT head Electronically Signed   By: Franchot Gallo M.D.   On: 02/27/2017 20:23    ____________________________________________   PROCEDURES Procedures  ____________________________________________   INITIAL IMPRESSION / ASSESSMENT AND PLAN / ED COURSE  Pertinent labs & imaging results that were available during my care of the patient were reviewed by me and considered in my medical decision making (see chart for details).  Patient presents with upper back pain and pain syncope and intermittent blurry vision. Somewhat bizarre constellation of symptoms. The pain complaints  appear to be musculoskeletal on exam, but with the associated syncope and neurologic complaints, this raises the possibility of dissection despite the absence of any trauma or rapid deceleration or connective tissue disorder in the history. CT angiogram of the chest neck and head ordered. Labs unremarkable, vitals unremarkable, afebrile.Considering the patient's symptoms, medical history, and physical examination today, I have low suspicion for ischemic stroke, intracranial hemorrhage, meningitis, encephalitis, carotid or vertebral dissection, venous sinus thrombosis, MS, intracranial hypertension, glaucoma, CRAO, CRVO, or temporal arteritis. Low suspicion of ACS PE pneumothorax pneumonia or infection. If CT is negative, would discharge to follow-up with neurology.      _______________  ----------------------------------------- 10:26 PM on 02/27/2017 -----------------------------------------  CT of chest neck and head unremarkable. Discharge the patient home to follow up with neurology and primary care. NSAIDs and limited amount of Valium for musculoskeletal pain in the meantime. _____________________________   FINAL CLINICAL IMPRESSION(S) / ED DIAGNOSES  Final diagnoses:  Acute midline thoracic back pain  Bilateral neck pain  Syncope, unspecified syncope type      New Prescriptions   No medications on file     Portions of this note were generated with dragon dictation software. Dictation errors may occur despite best attempts at proofreading.    Carrie Mew, MD 02/27/17 2227

## 2017-02-27 NOTE — Discharge Instructions (Signed)
Results for orders placed or performed during the hospital encounter of 86/76/19  Basic metabolic panel  Result Value Ref Range   Sodium 138 135 - 145 mmol/L   Potassium 4.0 3.5 - 5.1 mmol/L   Chloride 106 101 - 111 mmol/L   CO2 25 22 - 32 mmol/L   Glucose, Bld 89 65 - 99 mg/dL   BUN 14 6 - 20 mg/dL   Creatinine, Ser 0.98 0.44 - 1.00 mg/dL   Calcium 9.2 8.9 - 10.3 mg/dL   GFR calc non Af Amer >60 >60 mL/min   GFR calc Af Amer >60 >60 mL/min   Anion gap 7 5 - 15  CBC  Result Value Ref Range   WBC 10.7 3.6 - 11.0 K/uL   RBC 4.28 3.80 - 5.20 MIL/uL   Hemoglobin 13.2 12.0 - 16.0 g/dL   HCT 38.9 35.0 - 47.0 %   MCV 90.9 80.0 - 100.0 fL   MCH 30.8 26.0 - 34.0 pg   MCHC 33.8 32.0 - 36.0 g/dL   RDW 14.0 11.5 - 14.5 %   Platelets 287 150 - 440 K/uL  Urinalysis, Complete w Microscopic  Result Value Ref Range   Color, Urine YELLOW (A) YELLOW   APPearance HAZY (A) CLEAR   Specific Gravity, Urine 1.029 1.005 - 1.030   pH 5.0 5.0 - 8.0   Glucose, UA NEGATIVE NEGATIVE mg/dL   Hgb urine dipstick MODERATE (A) NEGATIVE   Bilirubin Urine NEGATIVE NEGATIVE   Ketones, ur NEGATIVE NEGATIVE mg/dL   Protein, ur NEGATIVE NEGATIVE mg/dL   Nitrite NEGATIVE NEGATIVE   Leukocytes, UA TRACE (A) NEGATIVE   RBC / HPF 0-5 0 - 5 RBC/hpf   WBC, UA 0-5 0 - 5 WBC/hpf   Bacteria, UA NONE SEEN NONE SEEN   Squamous Epithelial / LPF 0-5 (A) NONE SEEN   Mucous PRESENT   Pregnancy, urine POC  Result Value Ref Range   Preg Test, Ur NEGATIVE NEGATIVE   Ct Angio Head W Or Wo Contrast  Result Date: 02/27/2017 CLINICAL DATA:  Initial evaluation for acute syncope. EXAM: CT ANGIOGRAPHY HEAD AND NECK TECHNIQUE: Multidetector CT imaging of the head and neck was performed using the standard protocol during bolus administration of intravenous contrast. Multiplanar CT image reconstructions and MIPs were obtained to evaluate the vascular anatomy. Carotid stenosis measurements (when applicable) are obtained utilizing NASCET  criteria, using the distal internal carotid diameter as the denominator. CONTRAST:  125 cc of Isovue 370. COMPARISON:  Prior head CT from earlier the same day. FINDINGS: CTA NECK FINDINGS Aortic arch: Visualized aortic arch of normal caliber with normal branch pattern. No flow-limiting stenosis about the origin of the great vessels. Visualized subclavian artery is widely patent. Right carotid system: Right common and internal carotid artery's widely patent without stenosis, dissection, or occlusion. No atheromatous narrowing about the right carotid bifurcation. Left carotid system: Left common and internal carotid artery's widely patent without stenosis, dissection, or occlusion. No significant atheromatous narrowing about the left carotid bifurcation. Vertebral arteries: Both of the vertebral arteries arise from the subclavian arteries. Right vertebral artery dominant. Vertebral artery's widely patent within the neck without stenosis, dissection, or occlusion. Skeleton: No acute osseus abnormality. No worrisome lytic or blastic osseous lesions. Other neck: Soft tissues of the neck demonstrate no acute abnormality. Salivary glands normal. No adenopathy. Subcentimeter hypodense nodule noted within the right lobe of thyroid, of doubtful significance. Thyroid otherwise unremarkable. Upper chest: Visualized upper chest within normal limits. Visualized lungs are clear.  Review of the MIP images confirms the above findings CTA HEAD FINDINGS Anterior circulation: Petrous, cavernous, and supraclinoid segments widely patent without flow-limiting stenosis. ICA termini widely patent. A1 segments patent. Anterior communicating artery normal. Anterior cerebral arteries widely patent to their distal aspects. M1 segments patent without stenosis or occlusion. No proximal M2 occlusion. Distal MCA branches well opacified and symmetric. Posterior circulation: Vertebral arteries widely patent to the vertebrobasilar junction without  stenosis. Right vertebral artery dominant. Posterior inferior cerebral arteries patent bilaterally. Basilar artery widely patent. Superior cerebral arteries patent bilaterally. Both of the posterior cerebral arteries primarily supplied via the basilar and are widely patent to their distal aspects. Small bilateral posterior communicating arteries noted. Venous sinuses: Patent. Anatomic variants: No significant anatomic variant. No aneurysm or vascular malformation. Delayed phase: No pathologic enhancement. Review of the MIP images confirms the above findings IMPRESSION: Normal CTA of the head and neck. Electronically Signed   By: Jeannine Boga M.D.   On: 02/27/2017 22:18   Ct Head Wo Contrast  Result Date: 02/27/2017 CLINICAL DATA:  Syncope yesterday EXAM: CT HEAD WITHOUT CONTRAST TECHNIQUE: Contiguous axial images were obtained from the base of the skull through the vertex without intravenous contrast. COMPARISON:  CT head 04/12/2011 FINDINGS: Brain: No evidence of acute infarction, hemorrhage, hydrocephalus, extra-axial collection or mass lesion/mass effect. Vascular: No hyperdense vessel or unexpected calcification. Skull: Negative Sinuses/Orbits: Negative Other: None IMPRESSION: Negative CT head Electronically Signed   By: Franchot Gallo M.D.   On: 02/27/2017 20:23   Ct Angio Neck W And/or Wo Contrast  Result Date: 02/27/2017 CLINICAL DATA:  Initial evaluation for acute syncope. EXAM: CT ANGIOGRAPHY HEAD AND NECK TECHNIQUE: Multidetector CT imaging of the head and neck was performed using the standard protocol during bolus administration of intravenous contrast. Multiplanar CT image reconstructions and MIPs were obtained to evaluate the vascular anatomy. Carotid stenosis measurements (when applicable) are obtained utilizing NASCET criteria, using the distal internal carotid diameter as the denominator. CONTRAST:  125 cc of Isovue 370. COMPARISON:  Prior head CT from earlier the same day. FINDINGS:  CTA NECK FINDINGS Aortic arch: Visualized aortic arch of normal caliber with normal branch pattern. No flow-limiting stenosis about the origin of the great vessels. Visualized subclavian artery is widely patent. Right carotid system: Right common and internal carotid artery's widely patent without stenosis, dissection, or occlusion. No atheromatous narrowing about the right carotid bifurcation. Left carotid system: Left common and internal carotid artery's widely patent without stenosis, dissection, or occlusion. No significant atheromatous narrowing about the left carotid bifurcation. Vertebral arteries: Both of the vertebral arteries arise from the subclavian arteries. Right vertebral artery dominant. Vertebral artery's widely patent within the neck without stenosis, dissection, or occlusion. Skeleton: No acute osseus abnormality. No worrisome lytic or blastic osseous lesions. Other neck: Soft tissues of the neck demonstrate no acute abnormality. Salivary glands normal. No adenopathy. Subcentimeter hypodense nodule noted within the right lobe of thyroid, of doubtful significance. Thyroid otherwise unremarkable. Upper chest: Visualized upper chest within normal limits. Visualized lungs are clear. Review of the MIP images confirms the above findings CTA HEAD FINDINGS Anterior circulation: Petrous, cavernous, and supraclinoid segments widely patent without flow-limiting stenosis. ICA termini widely patent. A1 segments patent. Anterior communicating artery normal. Anterior cerebral arteries widely patent to their distal aspects. M1 segments patent without stenosis or occlusion. No proximal M2 occlusion. Distal MCA branches well opacified and symmetric. Posterior circulation: Vertebral arteries widely patent to the vertebrobasilar junction without stenosis. Right vertebral artery dominant. Posterior  inferior cerebral arteries patent bilaterally. Basilar artery widely patent. Superior cerebral arteries patent  bilaterally. Both of the posterior cerebral arteries primarily supplied via the basilar and are widely patent to their distal aspects. Small bilateral posterior communicating arteries noted. Venous sinuses: Patent. Anatomic variants: No significant anatomic variant. No aneurysm or vascular malformation. Delayed phase: No pathologic enhancement. Review of the MIP images confirms the above findings IMPRESSION: Normal CTA of the head and neck. Electronically Signed   By: Jeannine Boga M.D.   On: 02/27/2017 22:18   Ct Angio Chest Aorta W And/or Wo Contrast  Result Date: 02/27/2017 CLINICAL DATA:  Dizziness and syncope.  Upper back pain x3 days. EXAM: CT ANGIOGRAPHY CHEST WITH CONTRAST TECHNIQUE: Multidetector CT imaging of the chest was performed using the standard protocol during bolus administration of intravenous contrast. Multiplanar CT image reconstructions and MIPs were obtained to evaluate the vascular anatomy. CONTRAST:  125 cc Isovue 370 IV. COMPARISON:  None. FINDINGS: Cardiovascular: Satisfactory opacification of the pulmonary arteries to the segmental level. No evidence of pulmonary embolism. Normal heart size. No pericardial effusion. Normal branch pattern of the great vessels. No aortic aneurysm or dissection. Mediastinum/Nodes: No enlarged mediastinal, hilar, or axillary lymph nodes. Thyroid gland, trachea, and esophagus demonstrate no significant findings. Lungs/Pleura: Lungs are clear. No pleural effusion or pneumothorax. Upper Abdomen: No acute abnormality. Musculoskeletal: Mild S-shaped scoliosis of the thoracic spine without acute fracture. Review of the MIP images confirms the above findings. IMPRESSION: 1. No acute pulmonary embolus, aortic aneurysm or dissection. 2. Clear lungs bilaterally. 3. Mild S-shaped scoliosis of the thoracic spine. No acute osseous appearing abnormality. Electronically Signed   By: Ashley Royalty M.D.   On: 02/27/2017 22:06

## 2017-02-27 NOTE — ED Notes (Signed)
Pt returned from CT °

## 2017-03-18 ENCOUNTER — Emergency Department
Admission: EM | Admit: 2017-03-18 | Discharge: 2017-03-18 | Disposition: A | Discharge status: Home / Self Care | Payer: Medicaid Other | Attending: Emergency Medicine | Admitting: Emergency Medicine

## 2017-03-18 ENCOUNTER — Encounter: Payer: Self-pay | Admitting: Emergency Medicine

## 2017-03-18 DIAGNOSIS — T7840XA Allergy, unspecified, initial encounter: Secondary | ICD-10-CM | POA: Insufficient documentation

## 2017-03-18 DIAGNOSIS — J45909 Unspecified asthma, uncomplicated: Secondary | ICD-10-CM | POA: Diagnosis not present

## 2017-03-18 DIAGNOSIS — Z87891 Personal history of nicotine dependence: Secondary | ICD-10-CM | POA: Insufficient documentation

## 2017-03-18 DIAGNOSIS — L299 Pruritus, unspecified: Secondary | ICD-10-CM | POA: Diagnosis present

## 2017-03-18 DIAGNOSIS — Z791 Long term (current) use of non-steroidal anti-inflammatories (NSAID): Secondary | ICD-10-CM | POA: Diagnosis not present

## 2017-03-18 DIAGNOSIS — Z79899 Other long term (current) drug therapy: Secondary | ICD-10-CM | POA: Insufficient documentation

## 2017-03-18 MED ORDER — FAMOTIDINE 20 MG PO TABS
20.0000 mg | ORAL_TABLET | Freq: Once | ORAL | Status: AC
  Administered 2017-03-18: 20 mg via ORAL
  Filled 2017-03-18: qty 1

## 2017-03-18 MED ORDER — DEXAMETHASONE SODIUM PHOSPHATE 10 MG/ML IJ SOLN
10.0000 mg | Freq: Once | INTRAMUSCULAR | Status: AC
  Administered 2017-03-18: 10 mg via INTRAMUSCULAR
  Filled 2017-03-18: qty 1

## 2017-03-18 MED ORDER — PREDNISONE 10 MG (21) PO TBPK: ORAL_TABLET | ORAL | 0 refills | Status: AC

## 2017-03-18 MED ORDER — DIPHENHYDRAMINE HCL 50 MG/ML IJ SOLN
25.0000 mg | Freq: Once | INTRAMUSCULAR | Status: AC
  Administered 2017-03-18: 25 mg via INTRAMUSCULAR
  Filled 2017-03-18: qty 1

## 2017-03-18 NOTE — ED Notes (Signed)

## 2017-03-18 NOTE — ED Provider Notes (Signed)
[facility] Emergency Department Provider Note   ____________________________________________   I have reviewed the triage vital signs and the nursing notes.   HISTORY  Chief Complaint Allergic Reaction    HPI Brenda Joyce is a 26 y.o. female presents to emergency department with itching, nasal congestion, throat irritation and swelling, skin redness along the face and upper neck after taking an herbal life supplement earlier today. Patient reports this is the first time she had taken the product. Patient denies any severe throat swelling, swelling of the tongue, urticaria or other anaphylactic symptoms. Patient denies any no known allergies or drug allergies. Patient denies fever, chills, headache, vision changes, chest pain, chest tightness, shortness of breath, abdominal pain, nausea and vomiting.  Past Medical History:  Diagnosis Date  . Allergy   . Asthma   . 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 Scoliosis     Patient Active Problem List   Diagnosis Date Noted  . 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 Asthma Mild persistent  without complication 67/67/2094  . Seasonal and perennial allergic rhinitis 12/03/2011  . Scoliosis 12/03/2011  . Supervision of high-risk pregnancy 12/03/2011  . History of preterm delivery, currently pregnant 11/15/2011    Past Surgical History:  Procedure Laterality Date  . TONSILLECTOMY    . TONSILLECTOMY AND ADENOIDECTOMY  2006    Prior to Admission medications   Medication Sig Start Date End Date Taking? Authorizing Provider  albuterol (PROVENTIL HFA;VENTOLIN HFA) 108 (90 BASE) MCG/ACT inhaler Inhale 2 puffs into the lungs every 6 (six) hours as needed. For shortness of breath    [provider]  azelastine (ASTELIN) 137 MCG/SPRAY nasal spray Place 1 spray into the nose 2 (two) times daily. Use in each nostril as directed 01/08/13   Orlena Sheldon, PA-C  Azelastine-Fluticasone [facility]) 137-50 MCG/ACT  SUSP 1-2 puffs each nostril once daily 09/17/14   Baird Lyons D, MD  Azelastine-Fluticasone [facility]) 137-50 MCG/ACT SUSP Place 2 sprays into both nostrils at bedtime. 09/17/14 10/01/14  Deneise Lever, MD  beclomethasone (QVAR) 40 MCG/ACT inhaler Inhale 2 puffs into the lungs 2 (two) times daily.     [provider]  diazepam (VALIUM) 2 MG tablet Take 1 tablet (2 mg total) by mouth every 8 (eight) hours as needed for anxiety. 02/27/17 02/27/18  Carrie Mew, MD  fluticasone (VERAMYST) 27.5 MCG/SPRAY nasal spray Place 2 sprays into the nose daily.     [provider]  Fluticasone Furoate-Vilanterol (BREO ELLIPTA) 100-25 MCG/INH AEPB Inhale 1 puff, once daily- maintenance 09/17/14   Baird Lyons D, MD  naproxen (NAPROSYN) 500 MG tablet Take 1 tablet (500 mg total) by mouth 2 (two) times daily with a meal. 02/27/17   Carrie Mew, MD  predniSONE (STERAPRED UNI-PAK 21 TAB) 10 MG (21) TBPK tablet Take 6 tablets on day 1. Take 5 tablets on day 2. Take 4 tablets on day 3. Take 3 tablets on day 4. Take 2 tablets on day 5. Take 1 tablets on day 6. 03/18/17   Little, Traci M, PA-C    Allergies Patient has no known allergies.  Family History  Problem Relation Age of Onset  . Cancer Mother        stomach cancer  . Hypertension Mother   . Muscular dystrophy Mother   . Mental illness Father        bipolar, schizoprenia  . Cancer Sister        cervical  . Diabetes  Maternal Grandmother   . Diabetes Maternal Grandfather   . Diabetes Paternal Grandmother   . Diabetes Paternal Grandfather   . Anesthesia problems Neg Hx     Social History Social History  Substance Use Topics  . Smoking status: Former Smoker    Packs/day: 0.20    Years: 1.00    Types: Cigarettes    Quit date: 09/17/2009  . Smokeless tobacco: Never Used  . Alcohol use No    Review of Systems Constitutional: Negative for fever/chills Eyes: No visual changes. ENT:  Nasal congestion, throat irritation.  Rhinorrhea. Cardiovascular: Denies chest pain. Respiratory: Denies cough. Denies shortness of breath. Mild difficulty breathing. Gastrointestinal: No abdominal pain.  No nausea, vomiting, diarrhea. Genitourinary: Negative for dysuria. Musculoskeletal: Negative for back pain. Skin: Erythema along the face and upper neck. Neurological: Negative for headaches.  Negative focal weakness or numbness. Negative for loss of consciousness. Able to ambulate. ____________________________________________   PHYSICAL EXAM:  VITAL SIGNS: Patient Vitals for the past 24 hrs:  BP Temp Temp src Pulse Resp SpO2 Height Weight  03/18/17 2117 122/88 - - 91 12 97 % - -  03/18/17 1820 (!) 130/94 - - 97 20 100 % - -  03/18/17 1815 - - - - - - 5\' 3"  (1.6 m) 53.1 kg (117 lb)  03/18/17 1814 (!) 141/100 98.7 F (37.1 C) Oral (!) 102 20 99 % - -    Constitutional: Alert and oriented. Well appearing and in no acute distress.  Eyes: Conjunctivae are normal. PERRL. EOMI  Head: Normocephalic and atraumatic. ENT:      Ears: Canals clear. TMs intact bilaterally.      Nose: Congestion and rhinorrhea      Mouth/Throat: Mucous membranes are moist. Oropharynx Not edematous.  Neck:Supple. No thyromegaly. No stridor.  Cardiovascular: Normal rate, regular rhythm. Normal S1 and S2.  Good peripheral circulation. Respiratory: Normal respiratory effort without tachypnea or retractions. Lungs CTAB. Good air entry to the bases with no decreased or absent breath sounds. Hematological/Lymphatic/Immunological: No cervical lymphadenopathy. Cardiovascular: Normal rate, regular rhythm. Normal distal pulses. Respiratory: Normal respiratory effort. No wheezes/rales/rhonchi. Lungs CTAB with no W/R/R. Gastrointestinal: Bowel sounds 4 quadrants. Soft and nontender to palpation. No guarding or rigidity. No palpable masses. No distention. No CVA tenderness. Musculoskeletal: Nontender with normal range of motion in all  extremities. Neurologic: Normal speech and language. Skin:  Skin is warm, dry and intact. Erythema along face and upper neck without urticaria. Psychiatric: Mood and affect are normal. Speech and behavior are normal. Patient exhibits appropriate insight and judgement.  ____________________________________________   LABS (all labs ordered are listed, but only abnormal results are displayed)  Labs Reviewed - No data to display ____________________________________________  EKG None ____________________________________________  RADIOLOGY None ____________________________________________   PROCEDURES  Procedure(s) performed: no    Critical Care performed: no ____________________________________________   INITIAL IMPRESSION / ASSESSMENT AND PLAN / ED COURSE  Pertinent labs & imaging results that were available during my care of the patient were reviewed by me and considered in my medical decision making (see chart for details).   Patient presents to emergency department with allergic reaction related to ingesting an Herbalife product earlier today without anaphylactic reaction. History and physical exam findings are reassuring symptoms are consistent with moderate allergic reaction Patient responded well diphenhydramine 25 mg, Decadron 10 mg, and Pepcid 20 mg during the course of care in the emergency department. All symptoms resolved other than nasal congestion. Patient will be prescribed prednisone taper and advised to  take diphenhydramine as needed. Advised patient to refrain from using Herbalife products moving forward. Patient advised to follow up with PCP as needed or return to the emergency department if symptoms return or worsen.   ____________________________________________   FINAL CLINICAL IMPRESSION(S) / ED DIAGNOSES  Final diagnoses:  Allergic reaction, initial encounter       NEW MEDICATIONS STARTED DURING THIS VISIT:  Discharge Medication List as of  03/18/2017  9:26 PM    START taking these medications   Details  predniSONE (STERAPRED UNI-PAK 21 TAB) 10 MG (21) TBPK tablet Take 6 tablets on day 1. Take 5 tablets on day 2. Take 4 tablets on day 3. Take 3 tablets on day 4. Take 2 tablets on day 5. Take 1 tablets on day 6., Print         Note:  This document was prepared using Dragon voice recognition software and may include unintentional dictation errors.    Jerolyn Shin, PA-C 03/18/17 2314    Delman Kitten, MD 03/19/17 [phone]

## 2017-03-18 NOTE — ED Triage Notes (Signed)
States drank some supplements about noon today. Began feeling nasal congestion, throat irritation and trouble taking deep breath since noon. Breath sounds clear with good movement throughout. Speaking full sentences. Nasal congestion noticeable.

## 2017-03-18 NOTE — ED Notes (Signed)
States today she drank a herbal life tea and an aloe shot and apple cider vinegar tea, states after she drank it she began to feel as if her throat was swelling and itchy face, no hives, pt in no distress, resp even and unlabored, pt appears very anxious

## 2017-03-18 NOTE — Discharge Instructions (Signed)
Take medication as prescribed. Return to emergency department if symptoms worsen and follow-up with PCP as needed.   Take Benadryl as needed for symptoms.  I recommend not using Herbalife products due to current reaction you have been treated for an emergent department today.

## 2017-04-17 ENCOUNTER — Encounter: Payer: Self-pay | Admitting: *Deleted

## 2017-04-17 ENCOUNTER — Emergency Department
Admission: EM | Admit: 2017-04-17 | Discharge: 2017-04-17 | Disposition: A | Discharge status: Home / Self Care | Payer: Medicaid Other | Attending: Emergency Medicine | Admitting: Emergency Medicine

## 2017-04-17 DIAGNOSIS — J40 Bronchitis, not specified as acute or chronic: Secondary | ICD-10-CM | POA: Insufficient documentation

## 2017-04-17 DIAGNOSIS — Z791 Long term (current) use of non-steroidal anti-inflammatories (NSAID): Secondary | ICD-10-CM | POA: Insufficient documentation

## 2017-04-17 DIAGNOSIS — Z79899 Other long term (current) drug therapy: Secondary | ICD-10-CM | POA: Diagnosis not present

## 2017-04-17 DIAGNOSIS — Z87891 Personal history of nicotine dependence: Secondary | ICD-10-CM | POA: Diagnosis not present

## 2017-04-17 DIAGNOSIS — R509 Fever, unspecified: Secondary | ICD-10-CM | POA: Diagnosis present

## 2017-04-17 MED ORDER — BENZONATATE 100 MG PO CAPS: ORAL_CAPSULE | ORAL | 0 refills | Status: AC

## 2017-04-17 MED ORDER — AZITHROMYCIN 250 MG PO TABS: ORAL_TABLET | ORAL | 0 refills | Status: AC

## 2017-04-17 MED ORDER — IPRATROPIUM-ALBUTEROL 0.5-2.5 (3) MG/3ML IN SOLN
3.0000 mL | Freq: Once | RESPIRATORY_TRACT | Status: AC
  Administered 2017-04-17: 3 mL via RESPIRATORY_TRACT
  Filled 2017-04-17: qty 3

## 2017-04-17 MED ORDER — DEXAMETHASONE SODIUM PHOSPHATE 10 MG/ML IJ SOLN
10.0000 mg | Freq: Once | INTRAMUSCULAR | Status: AC
  Administered 2017-04-17: 10 mg via INTRAMUSCULAR
  Filled 2017-04-17: qty 1

## 2017-04-17 NOTE — ED Triage Notes (Signed)
Pt states cough, sore throat, fever and nasal congestion for 1 week, states ear congestion as well, awake and alert in no acute distress

## 2017-04-17 NOTE — Discharge Instructions (Signed)
You are being treated for a bronchitis. Take the antibotics as directed. Follow-up with your provider for worsening symptoms.

## 2017-04-19 NOTE — ED Provider Notes (Signed)
[facility] Emergency Department Provider Note ____________________________________________  Time seen: 2119  I have reviewed the triage vital signs and the nursing notes.  HISTORY  Chief Complaint  Fever and Nasal Congestion  HPI Brenda Joyce is a 26 y.o. female Presents to the ED for evaluation of cough, sore throat, fever, and nasal congestion for the last week. She also reports some ear pressure and fullness as well. She denies any nausea, vomiting, chills, or wheezing.she works as a Research scientist (medical), and admits to at times been exposed to mold, mildew, and other allergens. She has been working for the last several weeks without and approved respirator or other PPE.  Past Medical History:  Diagnosis Date  . Allergy   . Asthma   . 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 Scoliosis     Patient Active Problem List   Diagnosis Date Noted  . 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 Asthma Mild persistent  without complication 09/60/4540  . Seasonal and perennial allergic rhinitis 12/03/2011  . Scoliosis 12/03/2011  . Supervision of high-risk pregnancy 12/03/2011  . History of preterm delivery, currently pregnant 11/15/2011    Past Surgical History:  Procedure Laterality Date  . TONSILLECTOMY    . TONSILLECTOMY AND ADENOIDECTOMY  2006    Prior to Admission medications   Medication Sig Start Date End Date Taking? Authorizing Provider  albuterol (PROVENTIL HFA;VENTOLIN HFA) 108 (90 BASE) MCG/ACT inhaler Inhale 2 puffs into the lungs every 6 (six) hours as needed. For shortness of breath    [provider]  azelastine (ASTELIN) 137 MCG/SPRAY nasal spray Place 1 spray into the nose 2 (two) times daily. Use in each nostril as directed 01/08/13   Orlena Sheldon, PA-C  Azelastine-Fluticasone [facility]) 137-50 MCG/ACT SUSP 1-2 puffs each nostril once daily 09/17/14   Baird Lyons D, MD  Azelastine-Fluticasone [facility]) 137-50 MCG/ACT  SUSP Place 2 sprays into both nostrils at bedtime. 09/17/14 10/01/14  Deneise Lever, MD  azithromycin (ZITHROMAX Z-PAK) 250 MG tablet Take 2 tablets (500 mg) on  Day 1,  followed by 1 tablet (250 mg) once daily on Days 2 through 5. 04/17/17   [place], Dannielle Karvonen, PA-C  beclomethasone (QVAR) 40 MCG/ACT inhaler Inhale 2 puffs into the lungs 2 (two) times daily.     [provider]  benzonatate (TESSALON PERLES) 100 MG capsule Take 1-2 tabs TID prn cough 04/17/17   Wynetta Seith, Dannielle Karvonen, PA-C  diazepam (VALIUM) 2 MG tablet Take 1 tablet (2 mg total) by mouth every 8 (eight) hours as needed for anxiety. 02/27/17 02/27/18  Carrie Mew, MD  fluticasone (VERAMYST) 27.5 MCG/SPRAY nasal spray Place 2 sprays into the nose daily.     [provider]  Fluticasone Furoate-Vilanterol (BREO ELLIPTA) 100-25 MCG/INH AEPB Inhale 1 puff, once daily- maintenance 09/17/14   Baird Lyons D, MD  naproxen (NAPROSYN) 500 MG tablet Take 1 tablet (500 mg total) by mouth 2 (two) times daily with a meal. 02/27/17   Carrie Mew, MD  predniSONE (STERAPRED UNI-PAK 21 TAB) 10 MG (21) TBPK tablet Take 6 tablets on day 1. Take 5 tablets on day 2. Take 4 tablets on day 3. Take 3 tablets on day 4. Take 2 tablets on day 5. Take 1 tablets on day 6. 03/18/17   Little, Traci M, PA-C    Allergies Patient has no known allergies.  Family History  Problem Relation Age of Onset  . Cancer Mother  stomach cancer  . Hypertension Mother   . Muscular dystrophy Mother   . Mental illness Father        bipolar, schizoprenia  . Cancer Sister        cervical  . Diabetes Maternal Grandmother   . Diabetes Maternal Grandfather   . Diabetes Paternal Grandmother   . Diabetes Paternal Grandfather   . Anesthesia problems Neg Hx     Social History Social History  Substance Use Topics  . Smoking status: Former Smoker    Packs/day: 0.20    Years: 1.00    Types: Cigarettes    Quit date: 09/17/2009  . Smokeless  tobacco: Never Used  . Alcohol use No    Review of Systems  Constitutional: positive for fever. Eyes: Negative for visual changes. ENT: positive for sore throat and nasal congestion. Cardiovascular: Negative for chest pain. Respiratory: Negative for shortness of breath. Gastrointestinal: Negative for abdominal pain, vomiting and diarrhea. Neurological: Negative for headaches, focal weakness or numbness. ____________________________________________  PHYSICAL EXAM:  VITAL SIGNS: ED Triage Vitals  Enc Vitals Group     BP 04/17/17 1855 (!) 127/93     Pulse Rate 04/17/17 1855 (!) 115     Resp 04/17/17 1855 18     Temp 04/17/17 1855 99.2 F (37.3 C)     Temp Source 04/17/17 1855 Oral     SpO2 04/17/17 1855 99 %     Weight 04/17/17 1854 117 lb (53.1 kg)     Height 04/17/17 1854 5\' 3"  (1.6 m)     Head Circumference --      Peak Flow --      Pain Score 04/17/17 1854 5     Pain Loc --      Pain Edu? --      Excl. in [place]? --     Constitutional: Alert and oriented. Well appearing and in no distress. Head: Normocephalic and atraumatic. Eyes: Conjunctivae are normal. Normal extraocular movements Ears: Canals clear. TMs intact bilaterally. Nose: No congestion/rhinorrhea/epistaxis. Nasal turbinates are pink, edematous, but without erythema. A nasal polyp as noted in the right nostril. Mouth/Throat: Mucous membranes are moist. Uvula is midline and tonsils are flat. No oropharyngeal lesions are appreciated. Neck: Supple. No thyromegaly. Hematological/Lymphatic/Immunological: No cervical lymphadenopathy. Cardiovascular: Normal rate, regular rhythm. Normal distal pulses. Respiratory: Normal respiratory effort. No wheezes/rales/rhonchi. Intermittent cough during the exam. Gastrointestinal: Soft and nontender. No distention. ____________________________________________  PROCEDURES  Decadron 10 mg IM DuoNeb x 1 ____________________________________________  INITIAL IMPRESSION /  ASSESSMENT AND PLAN / ED COURSE  Patient ED evaluation what appears to be a acute bronchitis or a early upper respiratory infection. She'll be discharged with a prescription for [facility] and a Z-Pak to dose as directed. She is also advised to consider using over-the-counter allergy medicine for additional symptom relief. She should follow with primary care provider or follow up with his ED for worsening symptoms. A work note written to suggest that she not work and crawl spaces until her PPE is available. ____________________________________________  FINAL CLINICAL IMPRESSION(S) / ED DIAGNOSES  Final diagnoses:  Bronchitis      Carmie End, Dannielle Karvonen, PA-C 04/19/17 0121    Hinda Kehr, MD 04/19/17 [phone]

## 2017-11-12 ENCOUNTER — Ambulatory Visit (HOSPITAL_COMMUNITY)
Admission: EM | Admit: 2017-11-12 | Discharge: 2017-11-12 | Disposition: A | Discharge status: Home / Self Care | Payer: Medicaid Other | Attending: Family Medicine | Admitting: Family Medicine

## 2017-11-12 ENCOUNTER — Other Ambulatory Visit: Payer: Self-pay

## 2017-11-12 ENCOUNTER — Encounter (HOSPITAL_COMMUNITY): Payer: Self-pay | Admitting: Emergency Medicine

## 2017-11-12 DIAGNOSIS — H6122 Impacted cerumen, left ear: Secondary | ICD-10-CM

## 2017-11-12 DIAGNOSIS — H66002 Acute suppurative otitis media without spontaneous rupture of ear drum, left ear: Secondary | ICD-10-CM

## 2017-11-12 MED ORDER — HYDROCODONE-ACETAMINOPHEN 5-325 MG PO TABS: 1.0000 | ORAL_TABLET | Freq: Four times a day (QID) | ORAL | 0 refills | Status: AC | PRN

## 2017-11-12 MED ORDER — AMOXICILLIN 875 MG PO TABS: 875.0000 mg | ORAL_TABLET | Freq: Two times a day (BID) | ORAL | 0 refills | Status: AC

## 2017-11-12 NOTE — ED Triage Notes (Signed)
Pt reports left ear pain that started this morning.  Pt is crying from the pain.

## 2017-11-12 NOTE — ED Provider Notes (Signed)
[place]   [id number] 11/12/17 Arrival Time: 1448   SUBJECTIVE:  Brenda Joyce is a 27 y.o. female who presents to the urgent care with complaint of left ear pain that started this morning.  Pt is crying from the pain.   Past Medical History:  Diagnosis Date  . Allergy   . Asthma   . 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 Scoliosis    Family History  Problem Relation Age of Onset  . Cancer Mother        stomach cancer  . Hypertension Mother   . Muscular dystrophy Mother   . Mental illness Father        bipolar, schizoprenia  . Cancer Sister        cervical  . Diabetes Maternal Grandmother   . Diabetes Maternal Grandfather   . Diabetes Paternal Grandmother   . Diabetes Paternal Grandfather   . Anesthesia problems Neg Hx    Social History   Socioeconomic History  . Marital status: Divorced    Spouse name: Not on file  . Number of children: 3  . Years of education: Not on file  . Highest education level: Not on file  Occupational History  . Occupation: unemployed  Social Needs  . Financial resource strain: Not on file  . Food insecurity:    Worry: Not on file    Inability: Not on file  . Transportation needs:    Medical: Not on file    Non-medical: Not on file  Tobacco Use  . Smoking status: Former Smoker    Packs/day: 0.20    Years: 1.00    Pack years: 0.20    Types: Cigarettes    Last attempt to quit: 09/17/2009    Years since quitting: 8.1  . Smokeless tobacco: Never Used  Substance and Sexual Activity  . Alcohol use: No    Alcohol/week: 0.0 oz  . Drug use: No  . Sexual activity: Not Currently    Birth control/protection: None  Lifestyle  . Physical activity:    Days per week: Not on file    Minutes per session: Not on file  . Stress: Not on file  Relationships  . Social connections:    Talks on phone: Not on file    Gets together: Not on file    Attends religious service: Not on file    Active member of club or organization:  Not on file    Attends meetings of clubs or organizations: Not on file    Relationship status: Not on file  . Intimate partner violence:    Fear of current or ex partner: Not on file    Emotionally abused: Not on file    Physically abused: Not on file    Forced sexual activity: Not on file  Other Topics Concern  . Not on file  Social History Narrative  . Not on file   No outpatient medications have been marked as taking for the 11/12/17 encounter [facility] Encounter).   No Known Allergies    ROS: As per HPI, remainder of ROS negative.   OBJECTIVE:   Vitals:   11/12/17 1514  BP: (!) 122/91  Pulse: 92  Temp: 98.3 F (36.8 C)  TempSrc: Oral  SpO2: 100%     General appearance: alert; no distress Eyes: PERRL; EOMI; conjunctiva normal HENT: normocephalic; atraumatic; TMs normal on right and bulging and red on left, canal shows 90% block with black cerumen;  external ears normal  without trauma; nasal mucosa normal; oral mucosa normal Neck: supple Extremities: no cyanosis or edema; symmetrical with no gross deformities Skin: warm and dry Neurologic: normal gait; grossly normal Psychological: alert and cooperative; normal mood and affect      Labs:  Results for orders placed or performed during the hospital encounter of 33/35/45  Basic metabolic panel  Result Value Ref Range   Sodium 138 135 - 145 mmol/L   Potassium 4.0 3.5 - 5.1 mmol/L   Chloride 106 101 - 111 mmol/L   CO2 25 22 - 32 mmol/L   Glucose, Bld 89 65 - 99 mg/dL   BUN 14 6 - 20 mg/dL   Creatinine, Ser 0.98 0.44 - 1.00 mg/dL   Calcium 9.2 8.9 - 10.3 mg/dL   GFR calc non Af Amer >60 >60 mL/min   GFR calc Af Amer >60 >60 mL/min   Anion gap 7 5 - 15  CBC  Result Value Ref Range   WBC 10.7 3.6 - 11.0 K/uL   RBC 4.28 3.80 - 5.20 MIL/uL   Hemoglobin 13.2 12.0 - 16.0 g/dL   HCT 38.9 35.0 - 47.0 %   MCV 90.9 80.0 - 100.0 fL   MCH 30.8 26.0 - 34.0 pg   MCHC 33.8 32.0 - 36.0 g/dL   RDW 14.0 11.5 - 14.5 %     Platelets 287 150 - 440 K/uL  Urinalysis, Complete w Microscopic  Result Value Ref Range   Color, Urine YELLOW (A) YELLOW   APPearance HAZY (A) CLEAR   Specific Gravity, Urine 1.029 1.005 - 1.030   pH 5.0 5.0 - 8.0   Glucose, UA NEGATIVE NEGATIVE mg/dL   Hgb urine dipstick MODERATE (A) NEGATIVE   Bilirubin Urine NEGATIVE NEGATIVE   Ketones, ur NEGATIVE NEGATIVE mg/dL   Protein, ur NEGATIVE NEGATIVE mg/dL   Nitrite NEGATIVE NEGATIVE   Leukocytes, UA TRACE (A) NEGATIVE   RBC / HPF 0-5 0 - 5 RBC/hpf   WBC, UA 0-5 0 - 5 WBC/hpf   Bacteria, UA NONE SEEN NONE SEEN   Squamous Epithelial / LPF 0-5 (A) NONE SEEN   Mucus PRESENT   Pregnancy, urine POC  Result Value Ref Range   Preg Test, Ur NEGATIVE NEGATIVE    Labs Reviewed - No data to display  No results found.     ASSESSMENT & PLAN:  1. Impacted cerumen of left ear   2. Non-recurrent acute suppurative otitis media of left ear without spontaneous rupture of tympanic membrane     Meds ordered this encounter  Medications  . amoxicillin (AMOXIL) 875 MG tablet    Sig: Take 1 tablet (875 mg total) by mouth 2 (two) times daily.    Dispense:  20 tablet    Refill:  0  . HYDROcodone-acetaminophen (NORCO) 5-325 MG tablet    Sig: Take 1 tablet by mouth every 6 (six) hours as needed for moderate pain.    Dispense:  12 tablet    Refill:  0    Reviewed expectations re: course of current medical issues. Questions answered. Outlined signs and symptoms indicating need for more acute intervention. Patient verbalized understanding. After Visit Summary given.    Procedures:      Robyn Haber, MD 11/12/17 [phone]

## 2017-11-13 ENCOUNTER — Encounter (HOSPITAL_COMMUNITY): Payer: Self-pay | Admitting: Emergency Medicine

## 2017-11-13 ENCOUNTER — Ambulatory Visit (HOSPITAL_COMMUNITY)
Admission: EM | Admit: 2017-11-13 | Discharge: 2017-11-13 | Disposition: A | Discharge status: Home / Self Care | Payer: Medicaid Other | Attending: Family Medicine | Admitting: Family Medicine

## 2017-11-13 DIAGNOSIS — H66003 Acute suppurative otitis media without spontaneous rupture of ear drum, bilateral: Secondary | ICD-10-CM

## 2017-11-13 MED ORDER — FLUTICASONE PROPIONATE 50 MCG/ACT NA SUSP: 2.0000 | Freq: Every day | NASAL | 0 refills | Status: AC

## 2017-11-13 MED ORDER — IPRATROPIUM BROMIDE 0.06 % NA SOLN: 2.0000 | Freq: Four times a day (QID) | NASAL | 0 refills | Status: AC

## 2017-11-13 NOTE — ED Triage Notes (Signed)
Pt here yesterday for ear infection in her L ear, last night her ear started bleeding, states "I woke up with blood pouring out of my left ear and now I cant hear out of right ear very well"

## 2017-11-13 NOTE — Discharge Instructions (Signed)
Continue amoxicillin as directed. Add flonase and atrovent to your regimen to see if it can help with the symptoms. You can follow up here in 2 days if not noticing any improvement.

## 2017-11-13 NOTE — ED Provider Notes (Signed)
[place]    CSN: [id number] Arrival date & time: 11/13/17  1028     History   Chief Complaint Chief Complaint  Patient presents with  . Otalgia    HPI Brenda Joyce is a 27 y.o. female.   27 year old female comes in for right ear decrease in hearing after being seen yesterday.  States was diagnosed with ear infection of the left ear yesterday, and has had blood drainage of the left ear last night.  Woke up this morning,  with decrease in hearing of the right ear.  She has been taking amoxicillin as directed, and has had 2 doses of the medication.  Has history of sinus issues, with rhinorrhea and nasal congestion.  Denies fever, chills, night sweats.     Past Medical History:  Diagnosis Date  . Allergy   . Asthma   . 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 Scoliosis     Patient Active Problem List   Diagnosis Date Noted  . 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 Asthma Mild persistent  without complication 18/29/9371  . Seasonal and perennial allergic rhinitis 12/03/2011  . Scoliosis 12/03/2011  . Supervision of high-risk pregnancy 12/03/2011  . History of preterm delivery, currently pregnant 11/15/2011    Past Surgical History:  Procedure Laterality Date  . TONSILLECTOMY    . TONSILLECTOMY AND ADENOIDECTOMY  2006    OB History    Gravida  2   Para  2   Term  1   Preterm  1   AB  0   Living  2     SAB  0   TAB  0   Ectopic  0   Multiple  0   Live Births  2            Home Medications    Prior to Admission medications   Medication Sig Start Date End Date Taking? Authorizing Provider  albuterol (PROVENTIL HFA;VENTOLIN HFA) 108 (90 BASE) MCG/ACT inhaler Inhale 2 puffs into the lungs every 6 (six) hours as needed. For shortness of breath    [provider]  amoxicillin (AMOXIL) 875 MG tablet Take 1 tablet (875 mg total) by mouth 2 (two) times daily. 11/12/17   Robyn Haber, MD  azelastine (ASTELIN) 137 MCG/SPRAY  nasal spray Place 1 spray into the nose 2 (two) times daily. Use in each nostril as directed 01/08/13   Orlena Sheldon, PA-C  Azelastine-Fluticasone [facility]) 137-50 MCG/ACT SUSP 1-2 puffs each nostril once daily 09/17/14   Baird Lyons D, MD  Azelastine-Fluticasone [facility]) 137-50 MCG/ACT SUSP Place 2 sprays into both nostrils at bedtime. 09/17/14 10/01/14  Deneise Lever, MD  azithromycin (ZITHROMAX Z-PAK) 250 MG tablet Take 2 tablets (500 mg) on  Day 1,  followed by 1 tablet (250 mg) once daily on Days 2 through 5. 04/17/17   Menshew, Dannielle Karvonen, PA-C  beclomethasone (QVAR) 40 MCG/ACT inhaler Inhale 2 puffs into the lungs 2 (two) times daily.     [provider]  benzonatate (TESSALON PERLES) 100 MG capsule Take 1-2 tabs TID prn cough 04/17/17   Menshew, Dannielle Karvonen, PA-C  diazepam (VALIUM) 2 MG tablet Take 1 tablet (2 mg total) by mouth every 8 (eight) hours as needed for anxiety. 02/27/17 02/27/18  Carrie Mew, MD  fluticasone [facility]) 50 MCG/ACT nasal spray Place 2 sprays into both nostrils daily. 11/13/17   Tasia Catchings, Hortence Charter V, PA-C  fluticasone (VERAMYST) 27.5 MCG/SPRAY nasal spray  Place 2 sprays into the nose daily.     [provider]  Fluticasone Furoate-Vilanterol (BREO ELLIPTA) 100-25 MCG/INH AEPB Inhale 1 puff, once daily- maintenance 09/17/14   Baird Lyons D, MD  HYDROcodone-acetaminophen (NORCO) 5-325 MG tablet Take 1 tablet by mouth every 6 (six) hours as needed for moderate pain. 11/12/17   Robyn Haber, MD  ipratropium (ATROVENT) 0.06 % nasal spray Place 2 sprays into both nostrils 4 (four) times daily. 11/13/17   Tasia Catchings, Loye Vento V, PA-C  naproxen (NAPROSYN) 500 MG tablet Take 1 tablet (500 mg total) by mouth 2 (two) times daily with a meal. 02/27/17   Carrie Mew, MD  predniSONE (STERAPRED UNI-PAK 21 TAB) 10 MG (21) TBPK tablet Take 6 tablets on day 1. Take 5 tablets on day 2. Take 4 tablets on day 3. Take 3 tablets on day 4. Take 2 tablets on day 5. Take 1 tablets on day  6. 03/18/17   Little, Traci M, PA-C    Family History Family History  Problem Relation Age of Onset  . Cancer Mother        stomach cancer  . Hypertension Mother   . Muscular dystrophy Mother   . Mental illness Father        bipolar, schizoprenia  . Cancer Sister        cervical  . Diabetes Maternal Grandmother   . Diabetes Maternal Grandfather   . Diabetes Paternal Grandmother   . Diabetes Paternal Grandfather   . Anesthesia problems Neg Hx     Social History Social History   Tobacco Use  . Smoking status: Former Smoker    Packs/day: 0.20    Years: 1.00    Pack years: 0.20    Types: Cigarettes    Last attempt to quit: 09/17/2009    Years since quitting: 8.1  . Smokeless tobacco: Never Used  Substance Use Topics  . Alcohol use: No    Alcohol/week: 0.0 oz  . Drug use: No     Allergies   Patient has no known allergies.   Review of Systems Review of Systems  Reason unable to perform ROS: See HPI as above.     Physical Exam Triage Vital Signs ED Triage Vitals  Enc Vitals Group     BP 11/13/17 1119 108/76     Pulse Rate 11/13/17 1119 81     Resp 11/13/17 1119 18     Temp 11/13/17 1119 98 F (36.7 C)     Temp src --      SpO2 11/13/17 1119 100 %     Weight --      Height --      Head Circumference --      Peak Flow --      Pain Score 11/13/17 1120 0     Pain Loc --      Pain Edu? --      Excl. in [place]? --    No data found.  Updated Vital Signs BP 108/76   Pulse 81   Temp 98 F (36.7 C)   Resp 18   LMP 10/14/2017 (Exact Date)   SpO2 100%   Physical Exam  Constitutional: She is oriented to person, place, and time. She appears well-developed and well-nourished. No distress.  HENT:  Head: Normocephalic and atraumatic.  Right Ear: External ear and ear canal normal. Tympanic membrane is erythematous and bulging.  Left Ear: External ear and ear canal normal. Tympanic membrane is erythematous and bulging.  Left ear  TM with blood at the 1 o'clock  region without obvious perforation.   Eyes: Pupils are equal, round, and reactive to light. Conjunctivae are normal.  Neurological: She is alert and oriented to person, place, and time.     UC Treatments / Results  Labs (all labs ordered are listed, but only abnormal results are displayed) Labs Reviewed - No data to display  EKG None Radiology No results found.  Procedures Procedures (including critical care time)  Medications Ordered in UC Medications - No data to display   Initial Impression / Assessment and Plan / UC Course  I have reviewed the triage vital signs and the nursing notes.  Pertinent labs & imaging results that were available during my care of the patient were reviewed by me and considered in my medical decision making (see chart for details).    Case discussed with Dr Mannie Stabile, who also examined patient. No obvious perforation of left TM, now with right ear otitis media. Will have patient continue amoxicillin. Will add flonase/atrovent nasal spray to regimen. Can recheck in 2-3 days if symptoms not improving. Will consider ENT referral if symptoms still not improving. Patient expresses understanding and agrees to plan.   Final Clinical Impressions(s) / UC Diagnoses   Final diagnoses:  Non-recurrent acute suppurative otitis media of both ears without spontaneous rupture of tympanic membranes    ED Discharge Orders        Ordered    fluticasone (FLONASE) 50 MCG/ACT nasal spray  Daily     11/13/17 1148    ipratropium (ATROVENT) 0.06 % nasal spray  4 times daily     11/13/17 1148        [address] [place] 11/13/17 1201

## 2017-11-17 ENCOUNTER — Encounter (HOSPITAL_COMMUNITY): Payer: Self-pay | Admitting: *Deleted

## 2017-11-17 ENCOUNTER — Other Ambulatory Visit: Payer: Self-pay

## 2017-11-17 ENCOUNTER — Emergency Department (HOSPITAL_COMMUNITY)
Admission: EM | Admit: 2017-11-17 | Discharge: 2017-11-17 | Disposition: A | Discharge status: Home / Self Care | Payer: Medicaid Other | Attending: Emergency Medicine | Admitting: Emergency Medicine

## 2017-11-17 DIAGNOSIS — H9193 Unspecified hearing loss, bilateral: Secondary | ICD-10-CM | POA: Diagnosis not present

## 2017-11-17 DIAGNOSIS — Z87891 Personal history of nicotine dependence: Secondary | ICD-10-CM | POA: Diagnosis not present

## 2017-11-17 DIAGNOSIS — J45909 Unspecified asthma, uncomplicated: Secondary | ICD-10-CM | POA: Diagnosis not present

## 2017-11-17 DIAGNOSIS — H9203 Otalgia, bilateral: Secondary | ICD-10-CM | POA: Diagnosis present

## 2017-11-17 DIAGNOSIS — Z79899 Other long term (current) drug therapy: Secondary | ICD-10-CM | POA: Diagnosis not present

## 2017-11-17 DIAGNOSIS — H9313 Tinnitus, bilateral: Secondary | ICD-10-CM | POA: Insufficient documentation

## 2017-11-17 NOTE — ED Provider Notes (Signed)
[facility] DEPT Provider Note   CSN: [id number] Arrival date & time: 11/17/17  1736     History   Chief Complaint Chief Complaint  Patient presents with  . Otalgia    bil    HPI Brenda Joyce is a 27 y.o. female who presents with bilateral ear pain. PMH significant for recurrent ear infections. She states that she gets ear infections once every couple months. Usually Amoxicillin clears it up. She went to UC 2 days ago and had her left ear cleaned out and then was given antibiotics. It started to bleed and she had some decreased hearing so went back to UC who did not see a perforation but they thought her right ear looked infected and advised to add a nasal spray to reduce congestion. She states that her pain is not much better. She continues to have decreased hearing, headaches, tinnitus, and sharp ear pain. She has not had much further bleeding. She has never seen ENT for her ear problems which have been ongoing for quite some time. She denies vertiginous symptoms.  HPI  Past Medical History:  Diagnosis Date  . Allergy   . Asthma   . 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 Scoliosis     Patient Active Problem List   Diagnosis Date Noted  . 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 Asthma Mild persistent  without complication 30/16/0109  . Seasonal and perennial allergic rhinitis 12/03/2011  . Scoliosis 12/03/2011  . Supervision of high-risk pregnancy 12/03/2011  . History of preterm delivery, currently pregnant 11/15/2011    Past Surgical History:  Procedure Laterality Date  . TONSILLECTOMY    . TONSILLECTOMY AND ADENOIDECTOMY  2006     OB History    Gravida  2   Para  2   Term  1   Preterm  1   AB  0   Living  2     SAB  0   TAB  0   Ectopic  0   Multiple  0   Live Births  2            Home Medications    Prior to Admission medications   Medication Sig Start Date End Date Taking? Authorizing Provider    albuterol (PROVENTIL HFA;VENTOLIN HFA) 108 (90 BASE) MCG/ACT inhaler Inhale 2 puffs into the lungs every 6 (six) hours as needed. For shortness of breath    [provider]  amoxicillin (AMOXIL) 875 MG tablet Take 1 tablet (875 mg total) by mouth 2 (two) times daily. 11/12/17   Robyn Haber, MD  azelastine (ASTELIN) 137 MCG/SPRAY nasal spray Place 1 spray into the nose 2 (two) times daily. Use in each nostril as directed 01/08/13   Orlena Sheldon, PA-C  Azelastine-Fluticasone [facility]) 137-50 MCG/ACT SUSP 1-2 puffs each nostril once daily 09/17/14   Baird Lyons D, MD  Azelastine-Fluticasone [facility]) 137-50 MCG/ACT SUSP Place 2 sprays into both nostrils at bedtime. 09/17/14 10/01/14  Deneise Lever, MD  azithromycin (ZITHROMAX Z-PAK) 250 MG tablet Take 2 tablets (500 mg) on  Day 1,  followed by 1 tablet (250 mg) once daily on Days 2 through 5. 04/17/17   Menshew, Dannielle Karvonen, PA-C  beclomethasone (QVAR) 40 MCG/ACT inhaler Inhale 2 puffs into the lungs 2 (two) times daily.     [provider]  benzonatate (TESSALON PERLES) 100 MG capsule Take 1-2 tabs TID prn cough 04/17/17   Menshew, Dannielle Karvonen,  PA-C  diazepam (VALIUM) 2 MG tablet Take 1 tablet (2 mg total) by mouth every 8 (eight) hours as needed for anxiety. 02/27/17 02/27/18  Carrie Mew, MD  fluticasone [facility]) 50 MCG/ACT nasal spray Place 2 sprays into both nostrils daily. 11/13/17   Tasia Catchings, Amy V, PA-C  fluticasone (VERAMYST) 27.5 MCG/SPRAY nasal spray Place 2 sprays into the nose daily.     [provider]  Fluticasone Furoate-Vilanterol (BREO ELLIPTA) 100-25 MCG/INH AEPB Inhale 1 puff, once daily- maintenance 09/17/14   Baird Lyons D, MD  HYDROcodone-acetaminophen (NORCO) 5-325 MG tablet Take 1 tablet by mouth every 6 (six) hours as needed for moderate pain. 11/12/17   Robyn Haber, MD  ipratropium (ATROVENT) 0.06 % nasal spray Place 2 sprays into both nostrils 4 (four) times daily. 11/13/17   Tasia Catchings, Amy V, PA-C   naproxen (NAPROSYN) 500 MG tablet Take 1 tablet (500 mg total) by mouth 2 (two) times daily with a meal. 02/27/17   Carrie Mew, MD  predniSONE (STERAPRED UNI-PAK 21 TAB) 10 MG (21) TBPK tablet Take 6 tablets on day 1. Take 5 tablets on day 2. Take 4 tablets on day 3. Take 3 tablets on day 4. Take 2 tablets on day 5. Take 1 tablets on day 6. 03/18/17   Little, Traci M, PA-C    Family History Family History  Problem Relation Age of Onset  . Cancer Mother        stomach cancer  . Hypertension Mother   . Muscular dystrophy Mother   . Mental illness Father        bipolar, schizoprenia  . Cancer Sister        cervical  . Diabetes Maternal Grandmother   . Diabetes Maternal Grandfather   . Diabetes Paternal Grandmother   . Diabetes Paternal Grandfather   . Anesthesia problems Neg Hx     Social History Social History   Tobacco Use  . Smoking status: Former Smoker    Packs/day: 0.20    Years: 1.00    Pack years: 0.20    Types: Cigarettes    Last attempt to quit: 09/17/2009    Years since quitting: 8.1  . Smokeless tobacco: Never Used  Substance Use Topics  . Alcohol use: No    Alcohol/week: 0.0 oz  . Drug use: No     Allergies   Patient has no known allergies.   Review of Systems Review of Systems  Constitutional: Negative for fever.  HENT: Positive for ear pain and tinnitus. Negative for congestion and ear discharge.   Neurological: Positive for headaches. Negative for dizziness.     Physical Exam Updated Vital Signs BP 117/83 (BP Location: Right Arm)   Pulse 92   Temp 98.2 F (36.8 C) (Oral)   Resp 15   Ht 5\' 3"  (1.6 m)   Wt 55.3 kg (122 lb)   SpO2 100%   BMI 21.61 kg/m   Physical Exam  Constitutional: She is oriented to person, place, and time. She appears well-developed and well-nourished. No distress.  HENT:  Head: Normocephalic and atraumatic.  Right Ear: Hearing, external ear and ear canal normal. Tympanic membrane is scarred.  Left Ear: Hearing  and external ear normal. Tympanic membrane is scarred.  Dried blood in the left ear canal  Eyes: Pupils are equal, round, and reactive to light. Conjunctivae are normal. Right eye exhibits no discharge. Left eye exhibits no discharge. No scleral icterus.  Neck: Normal range of motion.  Cardiovascular: Normal rate.  Pulmonary/Chest:  Effort normal. No respiratory distress.  Abdominal: She exhibits no distension.  Neurological: She is alert and oriented to person, place, and time.  Skin: Skin is warm and dry.  Psychiatric: She has a normal mood and affect. Her behavior is normal.  Nursing note and vitals reviewed.    ED Treatments / Results  Labs (all labs ordered are listed, but only abnormal results are displayed) Labs Reviewed - No data to display  EKG None  Radiology No results found.  Procedures Procedures (including critical care time)  Medications Ordered in ED Medications - No data to display   Initial Impression / Assessment and Plan / ED Course  I have reviewed the triage vital signs and the nursing notes.  Pertinent labs & imaging results that were available during my care of the patient were reviewed by me and considered in my medical decision making (see chart for details).  27 year old female presents with ongoing bilateral ear pain. Vitals are normal. I do not see any obvious signs of infection on exam. Meniere's is a consideration but she does not have any dizziness. At this point, I think that she should see a specialist. I advised to continue meds prescribed to her but there is nothing that I could add. She verbalized understanding and would like to see ENT.   Final Clinical Impressions(s) / ED Diagnoses   Final diagnoses:  Acute ear pain, bilateral  Decreased hearing of both ears  Tinnitus of both ears    ED Discharge Orders    None       Recardo Evangelist, PA-C 11/17/17 2254    Tanna Furry, MD 11/17/17 [phone]

## 2017-11-17 NOTE — Discharge Instructions (Signed)
Please follow up with ENT Continue antibiotics and pain medicine prescribed to you

## 2017-11-17 NOTE — ED Triage Notes (Signed)
Pt has been seen and treated with antibiotics for bil ear infection without relief, diff hearing out of both ears as well as pain.

## 2019-05-28 IMAGING — CT CT ANGIO HEAD
2 of 8 series · 14 of 47 positions shown · IV contrast (APPLIED)
Comparison: Prior head CT from earlier the same day.

CLINICAL DATA: Initial evaluation for acute syncope.

EXAM:
CT ANGIOGRAPHY HEAD AND NECK
TECHNIQUE: Multidetector CT imaging of the head and neck was performed using
the standard protocol during bolus administration of intravenous
contrast. Multiplanar CT image reconstructions and MIPs were
obtained to evaluate the vascular anatomy. Carotid stenosis
measurements (when applicable) are obtained utilizing NASCET
criteria, using the distal internal carotid diameter as the
denominator.
CONTRAST:  125 cc of Isovue 370.

[Series 9: ax thin · axial · 0.39mm/px · z∈[-73,+232]mm · 11 of 362 slices shown]
[im 23/362  brain]
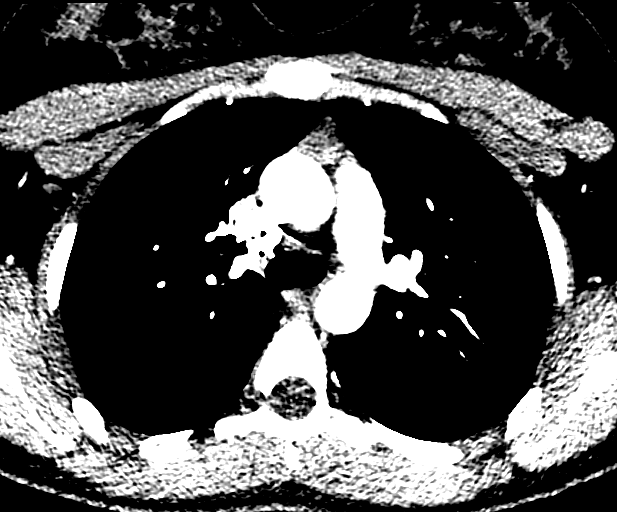
[im 68/362  bone]
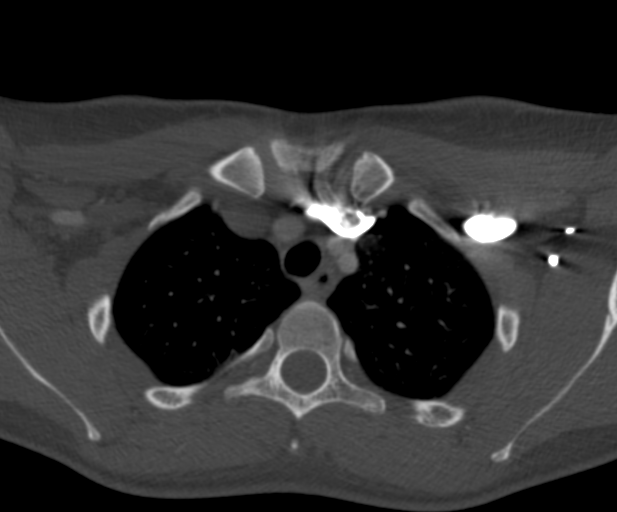
[im 91/362  brain]
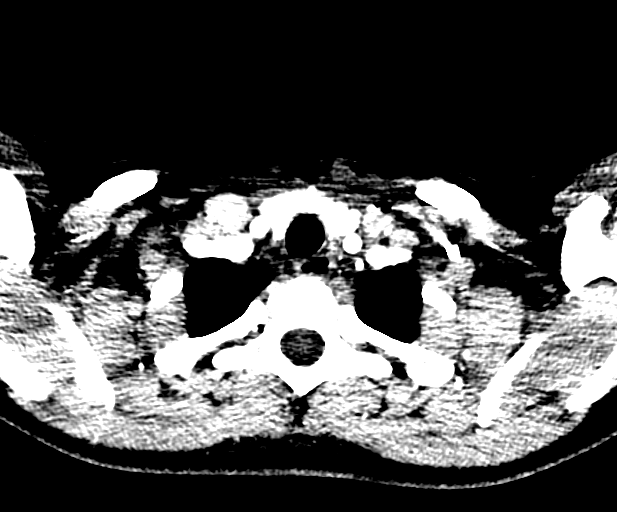
[im 113/362  bone]
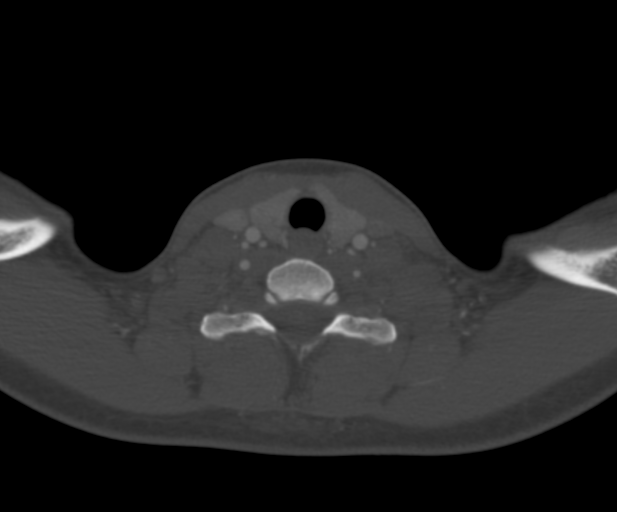
[im 158/362  brain]
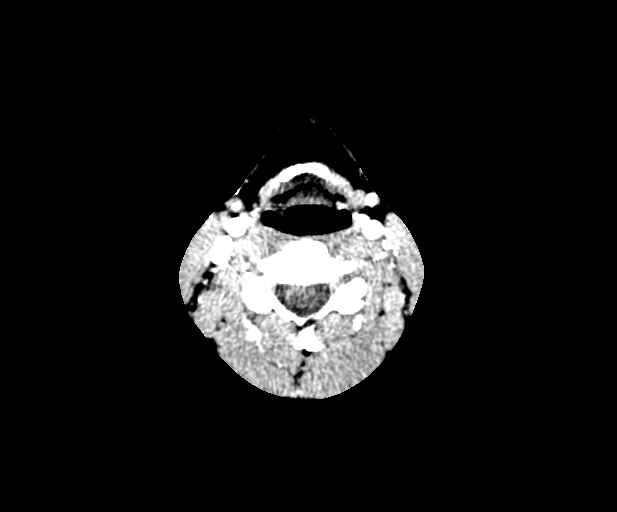
[im 181/362  bone]
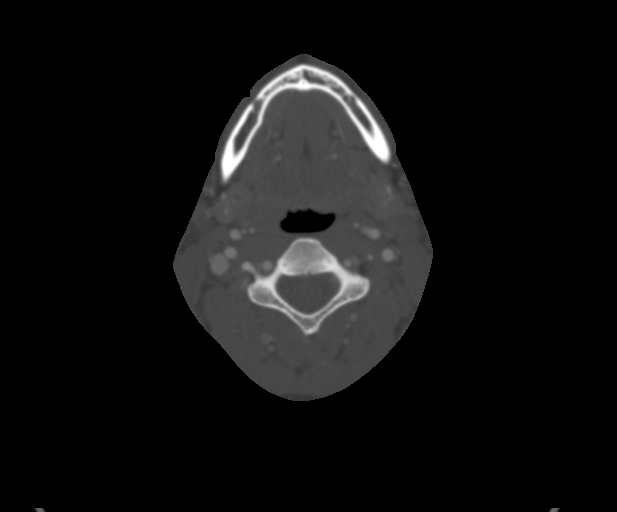
[im 204/362  brain]
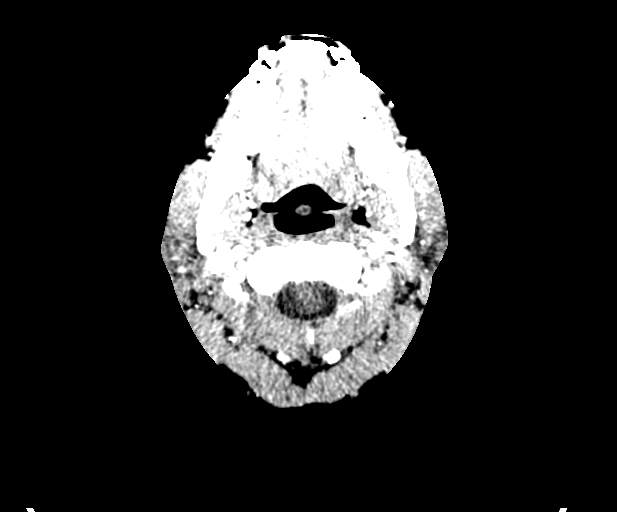
[im 249/362  bone]
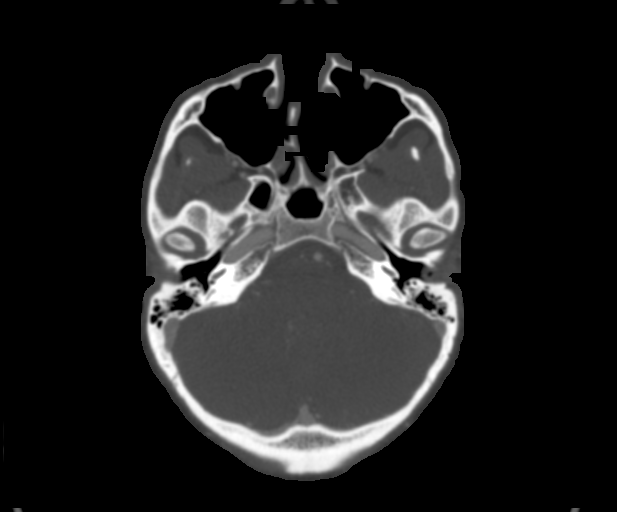
[im 271/362  brain]
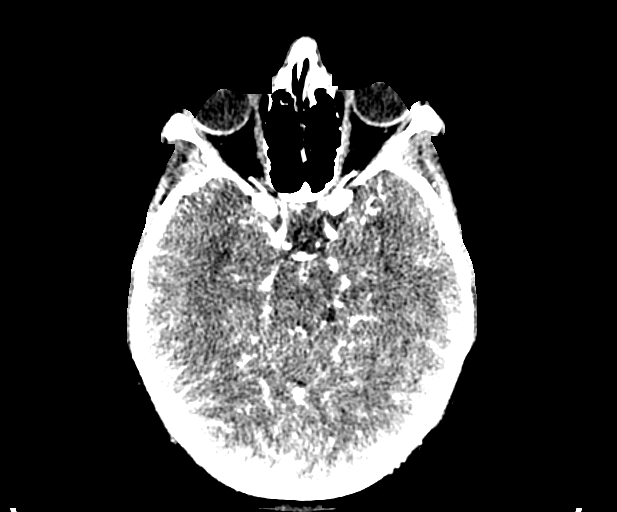
[im 294/362  bone]
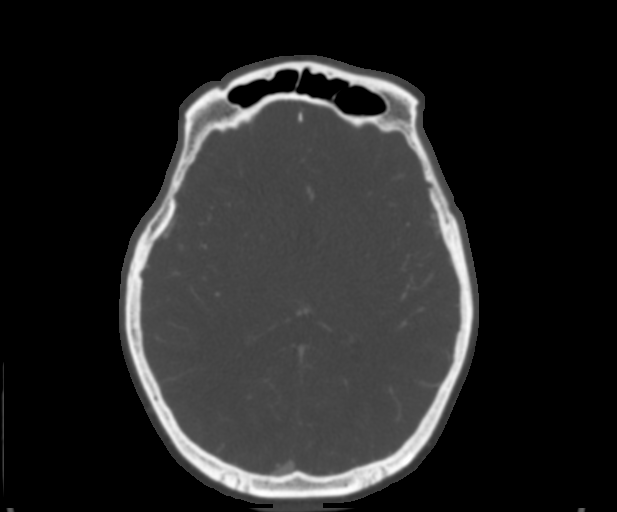
[im 339/362  brain]
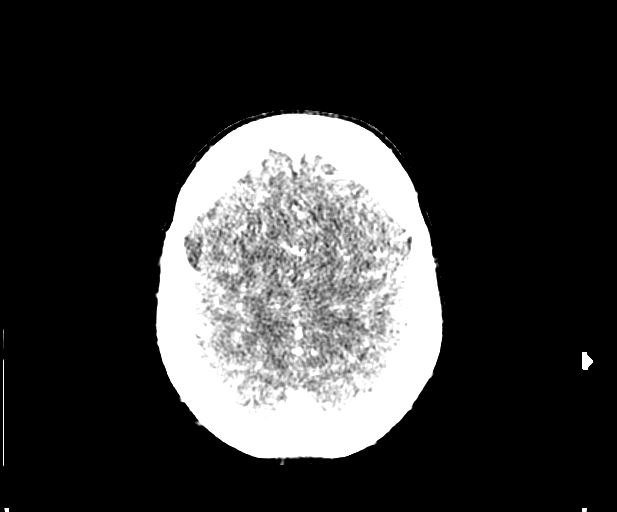

[Series 10: coronal thin · coronal · 0.47mm/px · 3 of 165 slices shown]
[im 47/165  brain]
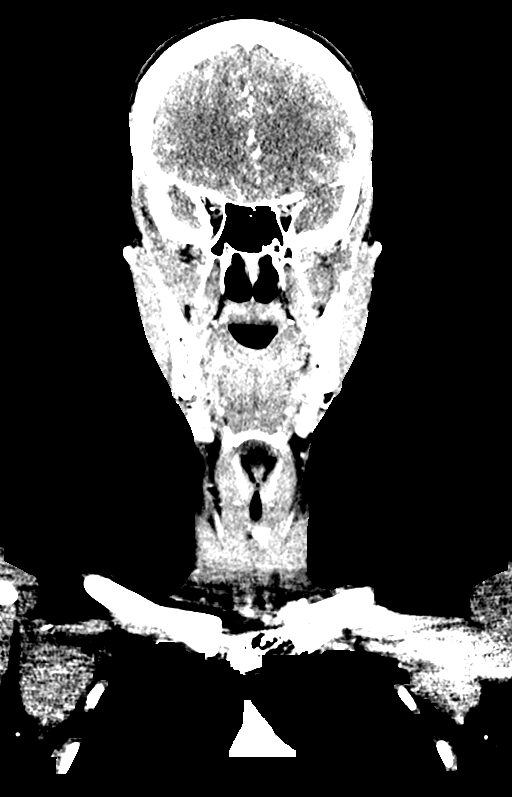
[im 71/165  brain]
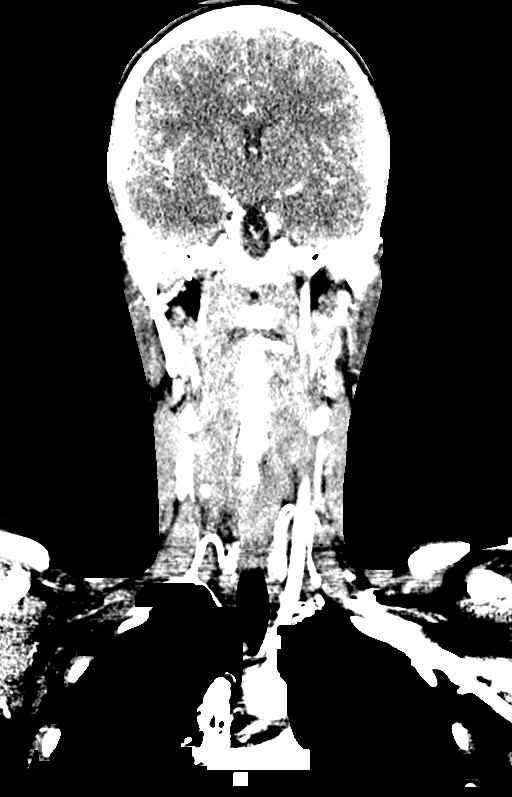
[im 94/165  brain]
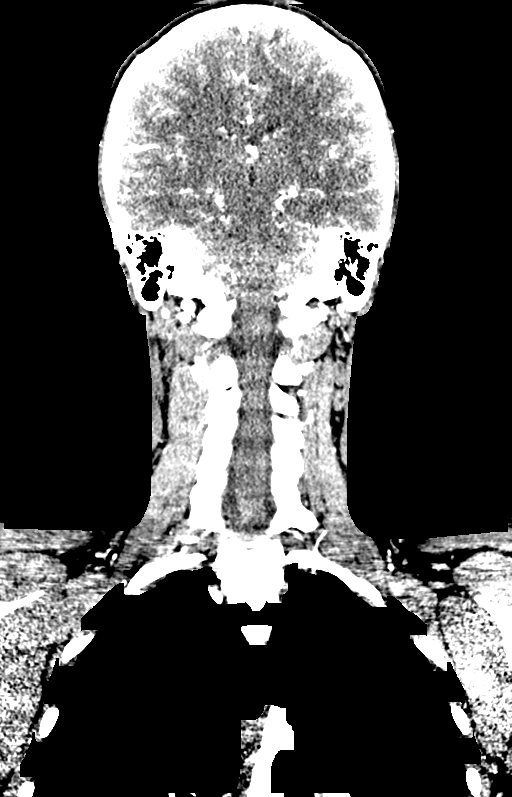

[14 of 47 positions shown; findings below may reference images not displayed]

FINDINGS: CTA NECK FINDINGS

Aortic arch: Visualized aortic arch of normal caliber with normal
branch pattern. No flow-limiting stenosis about the origin of the
great vessels. Visualized subclavian artery is widely patent.

Right carotid system: Right common and internal carotid artery's
widely patent without stenosis, dissection, or occlusion. No
atheromatous narrowing about the right carotid bifurcation.

Left carotid system: Left common and internal carotid artery's
widely patent without stenosis, dissection, or occlusion. No
significant atheromatous narrowing about the left carotid
bifurcation.

Vertebral arteries: Both of the vertebral arteries arise from the
subclavian arteries. Right vertebral artery dominant. Vertebral
artery's widely patent within the neck without stenosis, dissection,
or occlusion.

Skeleton: No acute osseus abnormality. No worrisome lytic or blastic
osseous lesions.

Other neck: Soft tissues of the neck demonstrate no acute
abnormality. Salivary glands normal. No adenopathy. Subcentimeter
hypodense nodule noted within the right lobe of thyroid, of doubtful
significance. Thyroid otherwise unremarkable.

Upper chest: Visualized upper chest within normal limits. Visualized
lungs are clear.

Review of the MIP images confirms the above findings

CTA HEAD FINDINGS

Anterior circulation: Petrous, cavernous, and supraclinoid segments
widely patent without flow-limiting stenosis. ICA termini widely
patent. A1 segments patent. Anterior communicating artery normal.
Anterior cerebral arteries widely patent to their distal aspects. M1
segments patent without stenosis or occlusion. No proximal M2
occlusion. Distal MCA branches well opacified and symmetric.

Posterior circulation: Vertebral arteries widely patent to the
vertebrobasilar junction without stenosis. Right vertebral artery
dominant. Posterior inferior cerebral arteries patent bilaterally.
Basilar artery widely patent. Superior cerebral arteries patent
bilaterally. Both of the posterior cerebral arteries primarily
supplied via the basilar and are widely patent to their distal
aspects. Small bilateral posterior communicating arteries noted.

Venous sinuses: Patent.

Anatomic variants: No significant anatomic variant. No aneurysm or
vascular malformation.

Delayed phase: No pathologic enhancement.

Review of the MIP images confirms the above findings
IMPRESSION: Normal CTA of the head and neck.
